# Patient Record
Sex: Female | Born: 1965
Health system: Southern US, Community
[De-identification: ages and names within clinical notes are randomized; demographics above are authoritative.]

## PROBLEM LIST (undated history)

## (undated) DIAGNOSIS — Z6828 Body mass index (BMI) 28.0-28.9, adult: Secondary | ICD-10-CM

## (undated) DIAGNOSIS — H8111 Benign paroxysmal vertigo, right ear: Secondary | ICD-10-CM

## (undated) DIAGNOSIS — R42 Dizziness and giddiness: Secondary | ICD-10-CM

## (undated) DIAGNOSIS — T4145XA Adverse effect of unspecified anesthetic, initial encounter: Secondary | ICD-10-CM

## (undated) DIAGNOSIS — R5383 Other fatigue: Secondary | ICD-10-CM

## (undated) DIAGNOSIS — F330 Major depressive disorder, recurrent, mild: Secondary | ICD-10-CM

## (undated) DIAGNOSIS — T8859XA Other complications of anesthesia, initial encounter: Secondary | ICD-10-CM

## (undated) DIAGNOSIS — E039 Hypothyroidism, unspecified: Secondary | ICD-10-CM

## (undated) DIAGNOSIS — C50919 Malignant neoplasm of unspecified site of unspecified female breast: Secondary | ICD-10-CM

## (undated) DIAGNOSIS — IMO0002 Reserved for concepts with insufficient information to code with codable children: Secondary | ICD-10-CM

## (undated) DIAGNOSIS — Z9889 Other specified postprocedural states: Secondary | ICD-10-CM

## (undated) DIAGNOSIS — IMO0001 Reserved for inherently not codable concepts without codable children: Secondary | ICD-10-CM

## (undated) DIAGNOSIS — E782 Mixed hyperlipidemia: Secondary | ICD-10-CM

## (undated) HISTORY — PX: AUGMENTATION MAMMAPLASTY: SUR837

## (undated) HISTORY — PX: TUBAL LIGATION: SHX77

## (undated) HISTORY — DX: Hypothyroidism, unspecified: E03.9

## (undated) HISTORY — DX: Benign paroxysmal vertigo, right ear: H81.11

## (undated) HISTORY — DX: Reserved for inherently not codable concepts without codable children: IMO0001

## (undated) HISTORY — DX: Reserved for concepts with insufficient information to code with codable children: IMO0002

## (undated) HISTORY — DX: Mixed hyperlipidemia: E78.2

## (undated) HISTORY — DX: Malignant neoplasm of unspecified site of unspecified female breast: C50.919

## (undated) HISTORY — DX: Dizziness and giddiness: R42

## (undated) HISTORY — DX: Major depressive disorder, recurrent, mild: F33.0

## (undated) HISTORY — DX: Body mass index (BMI) 28.0-28.9, adult: Z68.28

## (undated) HISTORY — DX: Other fatigue: R53.83

---

## 2001-11-03 HISTORY — PX: BREAST SURGERY: SHX581

## 2011-09-02 ENCOUNTER — Other Ambulatory Visit: Payer: Self-pay | Admitting: Unknown Physician Specialty

## 2011-09-02 DIAGNOSIS — R921 Mammographic calcification found on diagnostic imaging of breast: Secondary | ICD-10-CM

## 2011-09-11 ENCOUNTER — Ambulatory Visit
Admission: RE | Admit: 2011-09-11 | Discharge: 2011-09-11 | Disposition: A | Discharge status: Home / Self Care | Payer: No Typology Code available for payment source | Source: Ambulatory Visit | Attending: Unknown Physician Specialty | Admitting: Unknown Physician Specialty

## 2011-09-11 DIAGNOSIS — R921 Mammographic calcification found on diagnostic imaging of breast: Secondary | ICD-10-CM

## 2011-09-12 ENCOUNTER — Other Ambulatory Visit: Payer: Self-pay | Admitting: Unknown Physician Specialty

## 2011-09-12 DIAGNOSIS — C50911 Malignant neoplasm of unspecified site of right female breast: Secondary | ICD-10-CM

## 2011-09-18 ENCOUNTER — Other Ambulatory Visit: Payer: No Typology Code available for payment source

## 2011-09-19 ENCOUNTER — Ambulatory Visit (INDEPENDENT_AMBULATORY_CARE_PROVIDER_SITE_OTHER): Payer: No Typology Code available for payment source | Admitting: Surgery

## 2011-09-19 ENCOUNTER — Other Ambulatory Visit (INDEPENDENT_AMBULATORY_CARE_PROVIDER_SITE_OTHER): Payer: Self-pay | Admitting: Surgery

## 2011-09-19 ENCOUNTER — Encounter (INDEPENDENT_AMBULATORY_CARE_PROVIDER_SITE_OTHER): Payer: Self-pay | Admitting: Surgery

## 2011-09-19 VITALS — BP 118/76 | HR 70 | Temp 96.9°F | Resp 16 | Ht 65.5 in | Wt 173.2 lb

## 2011-09-19 DIAGNOSIS — C50911 Malignant neoplasm of unspecified site of right female breast: Secondary | ICD-10-CM

## 2011-09-19 DIAGNOSIS — D0590 Unspecified type of carcinoma in situ of unspecified breast: Secondary | ICD-10-CM

## 2011-09-19 DIAGNOSIS — D0510 Intraductal carcinoma in situ of unspecified breast: Secondary | ICD-10-CM | POA: Insufficient documentation

## 2011-09-19 NOTE — Progress Notes (Signed)
Patient ID: Dawn Barton, female   DOB: 01/21/1966, 45 y.o.   MRN: [id number]  Chief Complaint  Patient presents with  . Other    Right breast cancer    HPI Dawn Barton is a 45 y.o. female.  HPI Patient presents today at the request of Dr. Eagle due to a cluster of right breast microcalcifications. These are located in her lower outer quadrant of her right breast. 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 She denies any history of breast mass, breast pain or nipple discharge in either breast.  Past Medical History  Diagnosis Date  . Cancer     breast - right    Past Surgical History  Procedure Date  . Breast surgery 2003    breast lift    History reviewed. No pertinent family history.  Social History History  Substance Use Topics  . Smoking status: Former Smoker  . Smokeless tobacco: Never Used  . Alcohol Use: No    Not on File  No current outpatient prescriptions on file.    Review of Systems Review of Systems  Constitutional: Negative for fever, chills and unexpected weight change.  HENT: Negative for hearing loss, congestion, sore throat, trouble swallowing and voice change.   Eyes: Negative for visual disturbance.  Respiratory: Negative for cough and wheezing.   Cardiovascular: Negative for chest pain, palpitations and leg swelling.  Gastrointestinal: Negative for nausea, vomiting, abdominal pain, diarrhea, constipation, blood in stool, abdominal distention and anal bleeding.  Genitourinary: Negative for hematuria, vaginal bleeding and difficulty urinating.  Musculoskeletal: Negative for arthralgias.  Skin: Negative for rash and wound.  Neurological: Negative for seizures, syncope and headaches.  Hematological: Negative for adenopathy. Does not bruise/bleed easily.  Psychiatric/Behavioral: Negative for confusion.    Blood pressure 118/76, pulse 70, temperature 96.9 F (36.1 C), temperature source Temporal, resp. rate 16, height 5' 5.5" (1.664 m),  weight 173 lb 3.2 oz (78.563 kg).  Physical Exam Physical Exam  Constitutional: She is oriented to person, place, and time. She appears well-developed and well-nourished.  HENT:  Head: Normocephalic and atraumatic.  Eyes: EOM are normal. Pupils are equal, round, and reactive to light.  Neck: Normal range of motion. Neck supple.  Cardiovascular: Normal rate and regular rhythm.   Pulmonary/Chest: Effort normal and breath sounds normal.       Scars on both breasts from previous plastic surgical procedure consistent with a breast lift. No masses in either breast. Ecchymoses right breast lower noted. No evidence of extra adenopathy bilaterally.  Musculoskeletal: Normal range of motion.  Neurological: She is alert and oriented to person, place, and time.  Skin: Skin is warm and dry.  Psychiatric: She has a normal mood and affect. Her behavior is normal. Judgment and thought content normal.    Data Reviewed Mammogram Calcifications lower Right breast   Suspicious 1cm Core biopsy right breast  DCIS Low grade MRI BREAST no other abnormality Assessment    DCIS low grade right breast    Plan    Right breast lumpectomy.The procedure has been discussed with the patient. Alternatives to surgery have been discussed with the patient.  Risks of surgery include bleeding,  Infection,  Seroma formation, death,  and the need for further surgery.   The patient understands and wishes to proceed .Discussed mastectomy as an alternative to lumpectomy and radiation therapy. She understands potential need for radiation therapy after lumpectomy and wishes to proceed.        Darci Lykins A. 09/19/2011, 1:19 PM    

## 2011-09-19 NOTE — Patient Instructions (Signed)
Lumpectomy, Breast Conserving Surgery Care After Please read the instructions outlined below and refer to this sheet in the next few weeks. These discharge instructions provide you with general information on caring for yourself after you leave the hospital. Your surgeon may also give you specific instructions. While your treatment has been planned according to the most current medical practices available, unavoidable complications occasionally occur. If you have any problems or questions after discharge, please call your surgeon. Reasons for a lumpectomy:  Any solid breast mass.   Grouped significant nodularity that may be confused with a solitary breast mass.  AFTER THE PROCEDURE  After surgery, you will be taken to the recovery area where a nurse will watch and check your progress. Once you're awake, stable, and taking fluids well, barring other problems you will be allowed to go home.   Ice packs applied to your operative site may help with discomfort and keep the swelling down.   A small rubber drain may be placed in the incision for a couple of days to prevent a hematoma in the breast.   A pressure dressing may be applied for 24 to 48 hours to prevent bleeding.   Keep the wound dry.   You may resume a normal diet and activities as directed. Avoid strenuous activities affecting the arm on the side of the biopsy site such as tennis, swimming, heavy lifting (more than 10 pounds) or pulling.   Bruising in the breast is normal following this procedure.   Wearing a bra - even to bed - may be more comfortable and also help keep the dressing on.   Change dressings as directed.   Only take over-the-counter or prescription medicines for pain, discomfort, or fever as directed by your caregiver.  Call for your results as instructed by your surgeon. Remember it isyour responsibility to get the results of your lumpectomy if your surgeon asked you to follow-up. Do not assume everything is fine if  you have not heard from your caregiver. SEEK MEDICAL CARE IF:   There is increased bleeding (more than a small spot) from the wound.   You notice redness, swelling, or increasing pain in the wound.   Pus is coming from wound.   An unexplained oral temperature above 102 F (38.9 C) develops.   You notice a foul smell coming from the wound or dressing.  SEEK IMMEDIATE MEDICAL CARE IF:   You develop a rash.   You have difficulty breathing.   You have any allergic problems.  Document Released: 11/05/2006 Document Revised: 07/02/2011 Document Reviewed: 10/08/2007 [facility] Patient Information 2012 [place], [place].Lumpectomy, Breast Conserving Surgery A lumpectomy is breast surgery that removes only part of the breast. Another name used may be partial mastectomy. The amount removed varies. Make sure you understand how much of your breast will be removed. Reasons for a lumpectomy:  Any solid breast mass.   Grouped significant nodularity that may be confused with a solitary breast mass.  Lumpectomy is the most common form of breast cancer surgery today. The surgeon removes the portion of your breast which contains the tumor (cancer). This is the lump. Some normal tissue around the lump is also removed to be sure that all the tumor has been removed.  If cancer cells are found in the margins where the breast tissue was removed, your surgeon will do more surgery to remove the remaining cancer tissue. This is called re-excision surgery. Radiation and/or chemotherapy treatments are often given following a lumpectomy to kill any  cancer cells that could possibly remain.  REASONS YOU MAY NOT BE ABLE TO HAVE BREAST CONSERVING SURGERY:  The tumor is located in more than one place.   Your breast is small and the tumor is large so the breast would be disfigured.   The entire tumor removal is not successful with a lumpectomy.   You cannot commit to a full course of chemotherapy, radiation therapy or  are pregnant and cannot have radiation.   You have previously had radiation to the breast to treat cancer.  HOW A LUMPECTOMY IS PERFORMED If overnight nursing is not required following a biopsy, a lumpectomy can be performed as a same-day surgery. This can be done in a hospital, clinic, or surgical center. The anesthesia used will depend on your surgeon. They will discuss this with you. A general anesthetic keeps you sleeping through the procedure. LET YOUR CAREGIVERS KNOW ABOUT THE FOLLOWING:  Allergies   Medications taken including herbs, eye drops, over the counter medications, and creams.   Use of steroids (by mouth or creams)   Previous problems with anesthetics or Novocaine.   Possibility of pregnancy, if this applies   History of blood clots (thrombophlebitis)   History of bleeding or blood problems.   Previous surgery   Other health problems  BEFORE THE PROCEDURE You should be present one hour prior to your procedure unless directed otherwise.  AFTER THE PROCEDURE  After surgery, you will be taken to the recovery area where a nurse will watch and check your progress. Once you're awake, stable, and taking fluids well, barring other problems you will be allowed to go home.   Ice packs applied to your operative site may help with discomfort and keep the swelling down.   A small rubber drain may be placed in the breast for a couple of days to prevent a hematoma from developing in the breast.   A pressure dressing may be applied for 24 to 48 hours to prevent bleeding.   Keep the wound dry.   You may resume a normal diet and activities as directed. Avoid strenuous activities affecting the arm on the side of the biopsy site such as tennis, swimming, heavy lifting (more than 10 pounds) or pulling.   Bruising in the breast is normal following this procedure.   Wearing a bra - even to bed - may be more comfortable and also help keep the dressing on.   Change dressings as  directed.   Only take over-the-counter or prescription medicines for pain, discomfort, or fever as directed by your caregiver.  Call for your results as instructed by your surgeon. Remember it is your responsibility to get the results of your lumpectomy if your surgeon asked you to follow-up. Do not assume everything is fine if you have not heard from your caregiver. SEEK MEDICAL CARE IF:   There is increased bleeding (more than a small spot) from the wound.   You notice redness, swelling, or increasing pain in the wound.   Pus is coming from wound.   An unexplained oral temperature above 102 F (38.9 C) develops.   You notice a foul smell coming from the wound or dressing.  SEEK IMMEDIATE MEDICAL CARE IF:   You develop a rash.   You have difficulty breathing.   You have any allergic problems.  Document Released: 12/01/2006 Document Revised: 07/02/2011 Document Reviewed: 03/04/2007 [facility] Patient Information 2012 [place], [place].

## 2011-09-24 ENCOUNTER — Encounter (INDEPENDENT_AMBULATORY_CARE_PROVIDER_SITE_OTHER): Payer: Self-pay | Admitting: Obstetrics and Gynecology

## 2011-09-29 ENCOUNTER — Encounter (INDEPENDENT_AMBULATORY_CARE_PROVIDER_SITE_OTHER): Payer: Self-pay | Admitting: Surgery

## 2011-09-29 ENCOUNTER — Encounter (HOSPITAL_BASED_OUTPATIENT_CLINIC_OR_DEPARTMENT_OTHER): Payer: Self-pay | Admitting: *Deleted

## 2011-09-29 NOTE — Progress Notes (Signed)
To come in for labs and cxr

## 2011-09-30 ENCOUNTER — Encounter (HOSPITAL_BASED_OUTPATIENT_CLINIC_OR_DEPARTMENT_OTHER)
Admission: RE | Admit: 2011-09-30 | Discharge: 2011-09-30 | Disposition: A | Discharge status: Home / Self Care | Payer: No Typology Code available for payment source | Source: Ambulatory Visit | Attending: Surgery | Admitting: Surgery

## 2011-09-30 ENCOUNTER — Ambulatory Visit (HOSPITAL_COMMUNITY)
Admission: RE | Admit: 2011-09-30 | Discharge: 2011-09-30 | Disposition: A | Discharge status: Home / Self Care | Payer: No Typology Code available for payment source | Source: Ambulatory Visit | Attending: Surgery | Admitting: Surgery

## 2011-09-30 DIAGNOSIS — Z01818 Encounter for other preprocedural examination: Secondary | ICD-10-CM | POA: Insufficient documentation

## 2011-09-30 LAB — COMPREHENSIVE METABOLIC PANEL
ALT: 14 U/L (ref 0–35)
BUN: 10 mg/dL (ref 6–23)
CO2: 29 mEq/L (ref 19–32)
Calcium: 9.7 mg/dL (ref 8.4–10.5)
Creatinine, Ser: 0.69 mg/dL (ref 0.50–1.10)
GFR calc Af Amer: >90 mL/min (ref >90)
GFR calc non Af Amer: >90 mL/min (ref >90)
Glucose, Bld: 92 mg/dL (ref 70–99)
Total Protein: 7.8 g/dL (ref 6.0–8.3)

## 2011-09-30 LAB — CBC
HCT: 41.4 % (ref 36.0–46.0)
Hemoglobin: 14 g/dL (ref 12.0–15.0)
MCH: 30.6 pg (ref 26.0–34.0)
MCHC: 33.8 g/dL (ref 30.0–36.0)
MCV: 90.6 fL (ref 78.0–100.0)
RBC: 4.57 MIL/uL (ref 3.87–5.11)

## 2011-09-30 LAB — DIFFERENTIAL
Basophils Absolute: 0 10*3/uL (ref 0.0–0.1)
Eosinophils Relative: 3 % (ref 0–5)
Lymphocytes Relative: 36 % (ref 12–46)
Lymphs Abs: 2.1 10*3/uL (ref 0.7–4.0)
Neutro Abs: 3 10*3/uL (ref 1.7–7.7)
Neutrophils Relative %: 51 % (ref 43–77)

## 2011-10-02 ENCOUNTER — Ambulatory Visit
Admission: RE | Admit: 2011-10-02 | Discharge: 2011-10-02 | Disposition: A | Discharge status: Home / Self Care | Payer: No Typology Code available for payment source | Source: Ambulatory Visit | Attending: Surgery | Admitting: Surgery

## 2011-10-02 ENCOUNTER — Other Ambulatory Visit (INDEPENDENT_AMBULATORY_CARE_PROVIDER_SITE_OTHER): Payer: Self-pay | Admitting: Surgery

## 2011-10-02 ENCOUNTER — Encounter (HOSPITAL_BASED_OUTPATIENT_CLINIC_OR_DEPARTMENT_OTHER): Payer: Self-pay | Admitting: Certified Registered"

## 2011-10-02 ENCOUNTER — Encounter (HOSPITAL_BASED_OUTPATIENT_CLINIC_OR_DEPARTMENT_OTHER): Payer: Self-pay

## 2011-10-02 ENCOUNTER — Ambulatory Visit (HOSPITAL_BASED_OUTPATIENT_CLINIC_OR_DEPARTMENT_OTHER)
Admission: RE | Admit: 2011-10-02 | Discharge: 2011-10-02 | Disposition: A | Discharge status: Home / Self Care | Payer: No Typology Code available for payment source | Source: Ambulatory Visit | Attending: Surgery | Admitting: Surgery

## 2011-10-02 ENCOUNTER — Ambulatory Visit (HOSPITAL_BASED_OUTPATIENT_CLINIC_OR_DEPARTMENT_OTHER): Payer: No Typology Code available for payment source | Admitting: Certified Registered"

## 2011-10-02 ENCOUNTER — Encounter (HOSPITAL_BASED_OUTPATIENT_CLINIC_OR_DEPARTMENT_OTHER)
Admission: RE | Disposition: A | Discharge status: Home / Self Care | Payer: Self-pay | Source: Ambulatory Visit | Attending: Surgery

## 2011-10-02 ENCOUNTER — Encounter (HOSPITAL_BASED_OUTPATIENT_CLINIC_OR_DEPARTMENT_OTHER): Payer: Self-pay | Admitting: Surgery

## 2011-10-02 DIAGNOSIS — C50919 Malignant neoplasm of unspecified site of unspecified female breast: Secondary | ICD-10-CM | POA: Insufficient documentation

## 2011-10-02 DIAGNOSIS — Z01812 Encounter for preprocedural laboratory examination: Secondary | ICD-10-CM | POA: Insufficient documentation

## 2011-10-02 DIAGNOSIS — C50911 Malignant neoplasm of unspecified site of right female breast: Secondary | ICD-10-CM

## 2011-10-02 DIAGNOSIS — D0510 Intraductal carcinoma in situ of unspecified breast: Secondary | ICD-10-CM

## 2011-10-02 DIAGNOSIS — D0590 Unspecified type of carcinoma in situ of unspecified breast: Secondary | ICD-10-CM | POA: Insufficient documentation

## 2011-10-02 HISTORY — PX: BREAST LUMPECTOMY: SHX2

## 2011-10-02 HISTORY — DX: Malignant neoplasm of unspecified site of unspecified female breast: C50.919

## 2011-10-02 HISTORY — DX: Other complications of anesthesia, initial encounter: T88.59XA

## 2011-10-02 HISTORY — DX: Adverse effect of unspecified anesthetic, initial encounter: T41.45XA

## 2011-10-02 SURGERY — BREAST LUMPECTOMY
Anesthesia: General | Site: Breast | Laterality: Right | Wound class: Clean

## 2011-10-02 MED ORDER — CEFAZOLIN SODIUM-DEXTROSE 2-3 GM-% IV SOLR
2.0000 g | INTRAVENOUS | Status: AC
  Administered 2011-10-02: 2 g via INTRAVENOUS

## 2011-10-02 MED ORDER — EPHEDRINE SULFATE 50 MG/ML IJ SOLN
INTRAMUSCULAR | Status: DC | PRN
  Administered 2011-10-02: 5 mg via INTRAVENOUS

## 2011-10-02 MED ORDER — MIDAZOLAM HCL 5 MG/5ML IJ SOLN
INTRAMUSCULAR | Status: DC | PRN
  Administered 2011-10-02: 2 mg via INTRAVENOUS

## 2011-10-02 MED ORDER — LACTATED RINGERS IV SOLN
INTRAVENOUS | Status: DC
  Administered 2011-10-02: 12:00:00 via INTRAVENOUS

## 2011-10-02 MED ORDER — PROPOFOL 10 MG/ML IV EMUL
INTRAVENOUS | Status: DC | PRN
  Administered 2011-10-02: 170 mg via INTRAVENOUS

## 2011-10-02 MED ORDER — DEXAMETHASONE SODIUM PHOSPHATE 4 MG/ML IJ SOLN
INTRAMUSCULAR | Status: DC | PRN
  Administered 2011-10-02: 10 mg via INTRAVENOUS

## 2011-10-02 MED ORDER — ONDANSETRON HCL 4 MG/2ML IJ SOLN: 4.0000 mg | Freq: Once | INTRAMUSCULAR | Status: DC | PRN

## 2011-10-02 MED ORDER — FENTANYL CITRATE 0.05 MG/ML IJ SOLN
INTRAMUSCULAR | Status: DC | PRN
  Administered 2011-10-02: 100 ug via INTRAVENOUS

## 2011-10-02 MED ORDER — BUPIVACAINE-EPINEPHRINE 0.25% -1:200000 IJ SOLN
INTRAMUSCULAR | Status: DC | PRN
  Administered 2011-10-02: 20 mL

## 2011-10-02 MED ORDER — OXYCODONE-ACETAMINOPHEN 10-325 MG PO TABS: 1.0000 | ORAL_TABLET | ORAL | Status: DC | PRN

## 2011-10-02 MED ORDER — HYDROMORPHONE HCL PF 1 MG/ML IJ SOLN: 0.2500 mg | INTRAMUSCULAR | Status: DC | PRN

## 2011-10-02 MED ORDER — ONDANSETRON HCL 4 MG/2ML IJ SOLN
INTRAMUSCULAR | Status: DC | PRN
  Administered 2011-10-02: 4 mg via INTRAVENOUS

## 2011-10-02 SURGICAL SUPPLY — 42 items
BLADE SURG 15 STRL LF DISP TIS (BLADE) ×1 IMPLANT
BLADE SURG 15 STRL SS (BLADE) ×1
CANISTER SUCTION 1200CC (MISCELLANEOUS) ×2 IMPLANT
CHLORAPREP W/TINT 26ML (MISCELLANEOUS) ×2 IMPLANT
CLIP TI WIDE RED SMALL 6 (CLIP) ×2 IMPLANT
CLOTH BEACON ORANGE TIMEOUT ST (SAFETY) ×2 IMPLANT
COVER MAYO STAND STRL (DRAPES) ×2 IMPLANT
COVER TABLE BACK 60X90 (DRAPES) ×2 IMPLANT
DECANTER SPIKE VIAL GLASS SM (MISCELLANEOUS) IMPLANT
DERMABOND ADVANCED (GAUZE/BANDAGES/DRESSINGS) ×1
DERMABOND ADVANCED .7 DNX12 (GAUZE/BANDAGES/DRESSINGS) ×1 IMPLANT
DEVICE DUBIN W/COMP PLATE 8390 (MISCELLANEOUS) ×2 IMPLANT
DRAPE LAPAROSCOPIC ABDOMINAL (DRAPES) IMPLANT
DRAPE PED LAPAROTOMY (DRAPES) ×2 IMPLANT
DRAPE UTILITY XL STRL (DRAPES) ×2 IMPLANT
ELECT COATED BLADE 2.86 ST (ELECTRODE) ×2 IMPLANT
ELECT REM PT RETURN 9FT ADLT (ELECTROSURGICAL) ×2
ELECTRODE REM PT RTRN 9FT ADLT (ELECTROSURGICAL) ×1 IMPLANT
GLOVE BIO SURGEON STRL SZ 6.5 (GLOVE) ×4 IMPLANT
GLOVE BIOGEL PI IND STRL 8 (GLOVE) ×1 IMPLANT
GLOVE BIOGEL PI INDICATOR 8 (GLOVE) ×1
GLOVE ECLIPSE 8.0 STRL XLNG CF (GLOVE) ×2 IMPLANT
GOWN PREVENTION PLUS XLARGE (GOWN DISPOSABLE) ×4 IMPLANT
KIT MARKER MARGIN INK (KITS) ×2 IMPLANT
NEEDLE HYPO 25X1 1.5 SAFETY (NEEDLE) ×2 IMPLANT
NS IRRIG 1000ML POUR BTL (IV SOLUTION) ×2 IMPLANT
PACK BASIN DAY SURGERY FS (CUSTOM PROCEDURE TRAY) ×2 IMPLANT
PENCIL BUTTON HOLSTER BLD 10FT (ELECTRODE) ×2 IMPLANT
SLEEVE SCD COMPRESS KNEE MED (MISCELLANEOUS) ×2 IMPLANT
SPONGE LAP 4X18 X RAY DECT (DISPOSABLE) ×2 IMPLANT
STAPLER VISISTAT 35W (STAPLE) IMPLANT
SUT MON AB 4-0 PC3 18 (SUTURE) ×2 IMPLANT
SUT SILK 2 0 SH (SUTURE) IMPLANT
SUT VIC AB 3-0 SH 27 (SUTURE) ×1
SUT VIC AB 3-0 SH 27X BRD (SUTURE) ×1 IMPLANT
SYR BULB 3OZ (MISCELLANEOUS) ×2 IMPLANT
SYR CONTROL 10ML LL (SYRINGE) ×2 IMPLANT
TOWEL OR 17X24 6PK STRL BLUE (TOWEL DISPOSABLE) ×4 IMPLANT
TOWEL OR NON WOVEN STRL DISP B (DISPOSABLE) ×2 IMPLANT
TUBE CONNECTING 20X1/4 (TUBING) ×2 IMPLANT
WATER STERILE IRR 1000ML POUR (IV SOLUTION) ×2 IMPLANT
YANKAUER SUCT BULB TIP NO VENT (SUCTIONS) ×2 IMPLANT

## 2011-10-02 NOTE — H&P (View-Only) (Signed)
Patient ID: Dawn Barton, female   DOB: [birth year], 45 y.o.   MRN: [id number]  Chief Complaint  Patient presents with  . Other    Right breast cancer    HPI Dawn Barton is a 45 y.o. female.  HPI Patient presents today at the request of Dr. Deboraha Sprang due to a cluster of right breast microcalcifications. These are located in her lower outer quadrant of her right breast. 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 She denies any history of breast mass, breast pain or nipple discharge in either breast.  Past Medical History  Diagnosis Date  . Cancer     breast - right    Past Surgical History  Procedure Date  . Breast surgery 2003    breast lift    History reviewed. No pertinent family history.  Social History History  Substance Use Topics  . Smoking status: Former Games developer  . Smokeless tobacco: Never Used  . Alcohol Use: No    Not on File  No current outpatient prescriptions on file.    Review of Systems Review of Systems  Constitutional: Negative for fever, chills and unexpected weight change.  HENT: Negative for hearing loss, congestion, sore throat, trouble swallowing and voice change.   Eyes: Negative for visual disturbance.  Respiratory: Negative for cough and wheezing.   Cardiovascular: Negative for chest pain, palpitations and leg swelling.  Gastrointestinal: Negative for nausea, vomiting, abdominal pain, diarrhea, constipation, blood in stool, abdominal distention and anal bleeding.  Genitourinary: Negative for hematuria, vaginal bleeding and difficulty urinating.  Musculoskeletal: Negative for arthralgias.  Skin: Negative for rash and wound.  Neurological: Negative for seizures, syncope and headaches.  Hematological: Negative for adenopathy. Does not bruise/bleed easily.  Psychiatric/Behavioral: Negative for confusion.    Blood pressure 118/76, pulse 70, temperature 96.9 F (36.1 C), temperature source Temporal, resp. rate 16, height 5' 5.5" (1.664 m),  weight 173 lb 3.2 oz (78.563 kg).  Physical Exam Physical Exam  Constitutional: She is oriented to person, place, and time. She appears well-developed and well-nourished.  HENT:  Head: Normocephalic and atraumatic.  Eyes: EOM are normal. Pupils are equal, round, and reactive to light.  Neck: Normal range of motion. Neck supple.  Cardiovascular: Normal rate and regular rhythm.   Pulmonary/Chest: Effort normal and breath sounds normal.       Scars on both breasts from previous plastic surgical procedure consistent with a breast lift. No masses in either breast. Ecchymoses right breast lower noted. No evidence of extra adenopathy bilaterally.  Musculoskeletal: Normal range of motion.  Neurological: She is alert and oriented to person, place, and time.  Skin: Skin is warm and dry.  Psychiatric: She has a normal mood and affect. Her behavior is normal. Judgment and thought content normal.    Data Reviewed Mammogram Calcifications lower Right breast   Suspicious 1cm Core biopsy right breast  DCIS Low grade MRI BREAST no other abnormality Assessment    DCIS low grade right breast    Plan    Right breast lumpectomy.The procedure has been discussed with the patient. Alternatives to surgery have been discussed with the patient.  Risks of surgery include bleeding,  Infection,  Seroma formation, death,  and the need for further surgery.   The patient understands and wishes to proceed .Discussed mastectomy as an alternative to lumpectomy and radiation therapy. She understands potential need for radiation therapy after lumpectomy and wishes to proceed.        Lyndsy Gilberto A. 09/19/2011, 1:19 PM

## 2011-10-02 NOTE — Op Note (Signed)
Preop diagnosis: Right breast DCIS  Postop diagnosis: Same  Procedure right breast partial mastectomy with wire localization  Surgeon: Harriette Bouillon M.D.  EBL: 50 cc  Specimen: Right breast tissue with wire and clip  Anesthesia: Gen. endotracheal anesthesia with 0.25% bupivacaine with epinephrine  Drains: None  Indications for procedure: The patient is a 45 year old female diagnosed with right breast DCIS. This is a very small area. She was to undergo local excision. She understood there was a potential for postop radiation therapy depending on final pathology. Risks, benefits and alternative therapies were discussed. Risks of procedure were discussed and are outlined in the history and physical. She agrees to proceed.  Description of procedure: The patient brought back to the operating room after undergoing right breast wire localization at the breast center [facility]. Questions were answered. She's placed supine on the operating room table. After induction of general endotracheal anesthesia, right breast is prepped and draped in a sterile fashion. The wire exited the inferior portion of the right breast adjacent to a previous breast reduction scar. A vertical incision was placed just adjacent to the wire through the old breast reduction scar. All tissue around the wire was excised in its entirety. The tissue is then placed in the x-ray machine showing the wire and clip to be in the specimen. Balloon was irrigated and closed in layers with a deep layer of 3-0 Vicryl and 4-0 Monocryl. All final counts sponge, needle and instrument count to be correct. She was awoke taken to recovery in satisfactory condition.

## 2011-10-02 NOTE — Transfer of Care (Signed)
Immediate Anesthesia Transfer of Care Note  Patient: Dawn Barton  Procedure(s) Performed:  LUMPECTOMY - right breast lumpectomy  Patient Location: PACU  Anesthesia Type: General  Level of Consciousness: awake, alert , oriented and patient cooperative  Airway & Oxygen Therapy: Patient Spontanous Breathing and Patient connected to face mask oxygen  Post-op Assessment: Report given to PACU RN and Post -op Vital signs reviewed and stable  Post vital signs: Reviewed and stable  Complications: No apparent anesthesia complications

## 2011-10-02 NOTE — Interval H&P Note (Signed)
History and Physical Interval Note:  10/02/2011 10:25 AM  Dawn Barton  has presented today for surgery, with the diagnosis of cancer right breast  The various methods of treatment have been discussed with the patient and family. After consideration of risks, benefits and other options for treatment, the patient has consented to  Procedure(s): LUMPECTOMY as a surgical intervention .  The patients' history has been reviewed, patient examined, no change in status, stable for surgery.  I have reviewed the patients' chart and labs.  Questions were answered to the patient's satisfaction.     Zebediah Beezley A.

## 2011-10-02 NOTE — Anesthesia Preprocedure Evaluation (Signed)
Anesthesia Evaluation  Patient identified by MRN, date of birth, ID band Patient awake    Reviewed: Allergy & Precautions, H&P , NPO status , Patient's Chart, lab work & pertinent test results  Airway Mallampati: I TM Distance: >3 FB Neck ROM: Full    Dental  (+) Teeth Intact   Pulmonary neg pulmonary ROS,  clear to auscultation        Cardiovascular neg cardio ROS Regular Normal    Neuro/Psych    GI/Hepatic negative GI ROS, Neg liver ROS,   Endo/Other    Renal/GU      Musculoskeletal   Abdominal   Peds  Hematology   Anesthesia Other Findings   Reproductive/Obstetrics                           Anesthesia Physical Anesthesia Plan  ASA: I  Anesthesia Plan: General   Post-op Pain Management:    Induction: Intravenous  Airway Management Planned: LMA  Additional Equipment:   Intra-op Plan:   Post-operative Plan:   Informed Consent: I have reviewed the patients History and Physical, chart, labs and discussed the procedure including the risks, benefits and alternatives for the proposed anesthesia with the patient or authorized representative who has indicated his/her understanding and acceptance.   Dental advisory given  Plan Discussed with: CRNA and Surgeon  Anesthesia Plan Comments:         Anesthesia Quick Evaluation

## 2011-10-02 NOTE — Interval H&P Note (Signed)
History and Physical Interval Note:  10/02/2011 12:26 PM  Dawn Barton  has presented today for surgery, with the diagnosis of cancer right breast  The various methods of treatment have been discussed with the patient and family. After consideration of risks, benefits and other options for treatment, the patient has consented to  Procedure(s): LUMPECTOMY as a surgical intervention .  The patients' history has been reviewed, patient examined, no change in status, stable for surgery.  I have reviewed the patients' chart and labs.  Questions were answered to the patient's satisfaction.     [name]

## 2011-10-02 NOTE — Anesthesia Procedure Notes (Signed)
Procedure Name: LMA Insertion Date/Time: 10/02/2011 12:59 PM Performed by: Radford Pax Pre-anesthesia Checklist: Patient identified, Emergency Drugs available, Suction available and Patient being monitored Patient Re-evaluated:Patient Re-evaluated prior to inductionOxygen Delivery Method: Circle System Utilized Preoxygenation: Pre-oxygenation with 100% oxygen Intubation Type: IV induction Ventilation: Mask ventilation without difficulty LMA: LMA inserted LMA Size: 4.0 Number of attempts: 1 (good fit and airway, bite gard used on lt.) Placement Confirmation: positive ETCO2 and breath sounds checked- equal and bilateral Tube secured with: Tape (plastic tape) Dental Injury: Teeth and Oropharynx as per pre-operative assessment

## 2011-10-02 NOTE — Anesthesia Postprocedure Evaluation (Signed)
  Anesthesia Post-op Note  Patient: Dawn Barton  Procedure(s) Performed:  LUMPECTOMY - right breast lumpectomy  Patient Location: PACU  Anesthesia Type: General  Level of Consciousness: awake, alert  and oriented  Airway and Oxygen Therapy: Patient Spontanous Breathing  Post-op Pain: mild  Post-op Assessment: Post-op Vital signs reviewed and Patient's Cardiovascular Status Stable  Post-op Vital Signs: stable  Complications: No apparent anesthesia complications

## 2011-10-02 NOTE — Interval H&P Note (Signed)
History and Physical Interval Note:  10/02/2011 12:23 PM  Dawn Barton  has presented today for surgery, with the diagnosis of cancer right breast  The various methods of treatment have been discussed with the patient and family. After consideration of risks, benefits and other options for treatment, the patient has consented to  Procedure(s): LUMPECTOMY as a surgical intervention .  The patients' history has been reviewed, patient examined, no change in status, stable for surgery.  I have reviewed the patients' chart and labs.  Questions were answered to the patient's satisfaction.     [name] A.

## 2011-10-03 ENCOUNTER — Encounter (HOSPITAL_BASED_OUTPATIENT_CLINIC_OR_DEPARTMENT_OTHER): Payer: Self-pay | Admitting: Surgery

## 2011-10-06 ENCOUNTER — Telehealth (INDEPENDENT_AMBULATORY_CARE_PROVIDER_SITE_OTHER): Payer: Self-pay | Admitting: Surgery

## 2011-10-06 NOTE — Telephone Encounter (Signed)
Pt had a lumpectomy on 10/02/11, needs a 5-7 day po, Dr. Kristie Cowman soonest available is 10/17/11, please call.

## 2011-10-07 NOTE — Telephone Encounter (Signed)
PT'S APPT IS MADE FOR 10-17-11/ OK PER DR. Luisa Hart

## 2011-10-10 ENCOUNTER — Telehealth: Payer: Self-pay | Admitting: *Deleted

## 2011-10-10 NOTE — Telephone Encounter (Signed)
Confirmed 10/20/11 appts w/ pt for Dr. Donnie Coffin & Dr. Roselind Messier w/ pt. Mailed before appt letter & packet to pt.  Gave paperwork to [employer] in Med Rec for chart.

## 2011-10-14 ENCOUNTER — Other Ambulatory Visit: Payer: Self-pay | Admitting: *Deleted

## 2011-10-14 DIAGNOSIS — D0510 Intraductal carcinoma in situ of unspecified breast: Secondary | ICD-10-CM

## 2011-10-15 ENCOUNTER — Ambulatory Visit (INDEPENDENT_AMBULATORY_CARE_PROVIDER_SITE_OTHER): Payer: No Typology Code available for payment source | Admitting: Surgery

## 2011-10-15 ENCOUNTER — Encounter (INDEPENDENT_AMBULATORY_CARE_PROVIDER_SITE_OTHER): Payer: Self-pay | Admitting: Surgery

## 2011-10-15 VITALS — BP 118/80 | HR 72 | Temp 97.8°F | Resp 16 | Ht 65.5 in | Wt 178.4 lb

## 2011-10-15 DIAGNOSIS — Z9889 Other specified postprocedural states: Secondary | ICD-10-CM

## 2011-10-15 NOTE — Patient Instructions (Signed)
Follow up 1 month

## 2011-10-15 NOTE — Progress Notes (Signed)
Dawn Barton    [id number] 10/15/2011    July 01, 1966   CC: Post op  Right lumpectomy HPI: The patient returns for post op follow-up. She underwent a right lumpectomy  For DCIS. Over all she feels that she is doing well.   PE: The incision is healing nicely and there is no evidence of infection or hematoma.    DATA REVIEWED: Pathology report showed LOW GRADE DCIS  Margin 0.7 mm  IMPRESSION: Low grade DCIS Patient doing well.   PLAN: Her next visit will be in 1 MONTH To see med rad oncology next week.

## 2011-10-17 ENCOUNTER — Encounter: Payer: Self-pay | Admitting: *Deleted

## 2011-10-17 NOTE — Progress Notes (Signed)
Right Breast Lumpectomy 10/02/11=low grade ductal carcinoma in situ,ER/PR=positive Dr. Harriette Bouillon

## 2011-10-20 ENCOUNTER — Ambulatory Visit
Admission: RE | Admit: 2011-10-20 | Discharge: 2011-10-20 | Disposition: A | Discharge status: Home / Self Care | Payer: No Typology Code available for payment source | Source: Ambulatory Visit | Attending: Radiation Oncology | Admitting: Radiation Oncology

## 2011-10-20 ENCOUNTER — Other Ambulatory Visit (HOSPITAL_BASED_OUTPATIENT_CLINIC_OR_DEPARTMENT_OTHER): Payer: No Typology Code available for payment source | Admitting: Lab

## 2011-10-20 ENCOUNTER — Ambulatory Visit (HOSPITAL_BASED_OUTPATIENT_CLINIC_OR_DEPARTMENT_OTHER): Payer: No Typology Code available for payment source | Admitting: Oncology

## 2011-10-20 ENCOUNTER — Encounter: Payer: Self-pay | Admitting: Radiation Oncology

## 2011-10-20 ENCOUNTER — Ambulatory Visit: Payer: No Typology Code available for payment source

## 2011-10-20 VITALS — BP 116/68 | HR 65 | Temp 98.4°F | Resp 20 | Ht 66.0 in | Wt 180.2 lb

## 2011-10-20 VITALS — BP 106/67 | HR 62 | Temp 98.7°F | Ht 65.0 in | Wt 178.6 lb

## 2011-10-20 DIAGNOSIS — D0510 Intraductal carcinoma in situ of unspecified breast: Secondary | ICD-10-CM

## 2011-10-20 DIAGNOSIS — D0590 Unspecified type of carcinoma in situ of unspecified breast: Secondary | ICD-10-CM

## 2011-10-20 DIAGNOSIS — Z510 Encounter for antineoplastic radiation therapy: Secondary | ICD-10-CM | POA: Insufficient documentation

## 2011-10-20 LAB — COMPREHENSIVE METABOLIC PANEL
Albumin: 4.2 g/dL (ref 3.5–5.2)
CO2: 30 mEq/L (ref 19–32)
Glucose, Bld: 84 mg/dL (ref 70–99)
Potassium: 4 mEq/L (ref 3.5–5.3)
Sodium: 141 mEq/L (ref 135–145)
Total Bilirubin: 0.2 mg/dL — ABNORMAL LOW (ref 0.3–1.2)
Total Protein: 7.4 g/dL (ref 6.0–8.3)

## 2011-10-20 LAB — CBC WITH DIFFERENTIAL/PLATELET
Eosinophils Absolute: 0.1 10*3/uL (ref 0.0–0.5)
HCT: 41.2 % (ref 34.8–46.6)
HGB: 14.3 g/dL (ref 11.6–15.9)
LYMPH%: 29.8 % (ref 14.0–49.7)
MONO#: 0.4 10*3/uL (ref 0.1–0.9)
NEUT#: 3.5 10*3/uL (ref 1.5–6.5)
Platelets: 210 10*3/uL (ref 145–400)
RBC: 4.51 10*6/uL (ref 3.70–5.45)
WBC: 5.8 10*3/uL (ref 3.9–10.3)

## 2011-10-20 NOTE — Progress Notes (Signed)
Married, 2 children, no family hx ovarian or breast ca  ,  Nkda

## 2011-10-20 NOTE — Progress Notes (Signed)
CC:   Thomas A. Cornett, M.D. Pierce Crane, M.D., F.R.C.P.C. Maximiano Coss  REFERRING PHYSICIAN:  Clovis Pu. Cornett, M.D.  DIAGNOSIS:  Intraductal carcinoma of the right breast.  HISTORY OF PRESENT ILLNESS:  Dawn Barton is a very pleasant 45 year old female who is seen out of the courtesy of Dr. Luisa Hart as it relates to the patient's recently diagnosed right breast cancer.  Dawn Barton, on a routine screening mammography earlier this year within the [facility], was noted have some calcifications in the lower-outer aspect of the right breast.  Additional imaging was subsequently performed which confirmed these suspicious calcifications.  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  In light of these findings, the patient proceeded to undergo MRI in the [place] area, which showed some biopsy changes but no other areas of concern in the right breast or axillary region.  The patient was subsequently seen by Dr. Luisa Hart, and on November 29th, the patient proceeded to undergo a partial mastectomy. Upon pathologic review the patient was found have a low-grade ductal carcinoma in situ.  The surgical margins were clear with the closest margin being 0.7 cm (lateral and medial).  The estimated tumor size, based on initial biopsy and subsequent lumpectomy, was approximately 1 cm.  The tumor was estrogen receptor positive at 100%, and progesterone receptor positive at 4%.  The patient has been doing well since her surgery with minimal discomfort.  The patient is now seen in Radiation Oncology for consultation as it relates to her new diagnosis of breast cancer.  ALLERGIES:  The patient has no known drug allergies.  MEDICATIONS: The patient is on no prescription medication.  PAST MEDICAL HISTORY:  The patient has no significant medical  history.  PAST SURGICAL HISTORY:  Prior surgeries include a tubal ligation, cosmetic surgery of the breast area in 2003 with a breast lift.  SOCIAL HISTORY:  The patient lives in the [place] area with her husband and family.  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  The patient has occasional alcohol intake.  The patient is a former smoker.  FAMILY HISTORY:  No family history of breast, ovarian, or other type of malignancy.  REVIEW OF SYSTEMS:  As above, the patient has had some mild discomfort in the breast, but otherwise is doing well.  The patient denies any new bony pain, headaches, dizziness or blurred vision.  The patient has not had a menstrual cycle since March of this year.  The patient does admit to being somewhat emotional over the past few months.  A complete review of systems is undertaken with the patient, and other than the above- mentioned issues is unremarkable.  PHYSICAL EXAMINATION:  General:  This a very pleasant, 45 year old female in no acute distress.  The patient is accompanied by her daughter.  On evaluation today, vital signs:  Temperature 98.4, pulse 65, blood pressure 116/68.  The patient's weight is 180.32 and height is 5 feet 6 inches.  Examination of the pupils reveals them to be equal, round and reactive to light.  The extraocular eye movements are intact. The tongue is midline.  There is no secondary infection noted in the oral cavity or posterior pharynx.  The patient's teeth are in good repair.  Examination of the neck and supraclavicular region reveals no evidence of adenopathy.  The axillary areas are free of adenopathy.  Examination of the lungs reveals them to be clear.  The heart has a regular rhythm and rate.  Examination of the left breast reveals no mass or nipple discharge.  The patient has scars in the breast from prior cosmetic surgery.  Examination right breast reveals prior signs of cosmetic surgery.  In  the lower-outer quadrant of the right breast is a scar which is healing well.  There are no signs of drainage or infection.  There is no dominant mass appreciated in the breast, nipple discharge, or bleeding.  Examination the abdomen reveals it to be soft and nontender with normal bowel sounds.  On neurological examination, motor strength is 5/5 in the proximal and distal muscle groups of the upper and lower extremities.  Peripheral pulses are good.  There is no appreciable cyanosis, clubbing or edema.  LABORATORY DATA:  October 02, 2011:  White count 5.9, hemoglobin 14.0, hematocrit 41.4, platelet count 197,000.  Glucose is 92, BUN 10, creatinine 0.69.  X-RAY STUDIES:  As summarized in the HPI.  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  As above, the estimated tumor size was approximately 1 cm based on initial biopsy and lumpectomy.  The patient's surgical margins are clear.  I discussed with Dawn Barton that in general she has an excellent prognosis with a low-grade intraductal breast cancer which has been completely excised.  The patient's risk for recurrence within the breast would be relatively low; however, given the patient's young age, I would be in favor of radiation therapy as part of breast conservation treatment.  I discussed the overall treatment course, side effects, and potential toxicities of radiation therapy in this situation with Dawn Barton.  The patient appears to understand and wishes to proceed with the planned course of treatment.  The patient will return on December 31st for planning, with her treatments to begin the 1st week in January. I anticipate approximately 6-1/2 weeks as part of the patient's overall management.  Later today the patient will be meeting with Dr. Donnie Coffin for a medical oncology evaluation.    ______________________________ Billie Lade, Ph.D., M.D. JDK/MEDQ  D:  10/20/2011  T:  10/20/2011  Job:   [id number]

## 2011-10-20 NOTE — Progress Notes (Signed)
Please see the Nurse Progress Note in the MD Initial Consult Encounter for this patient. 

## 2011-10-20 NOTE — Progress Notes (Signed)
Patient History and Physical   Dawn Barton [id number] 1965-11-15 45 y.o. 10/20/2011  CC: Dr Dawn Barton, dr Dawn Barton  Chief Complaint: DCIS  HPI:  [name] from [place] in previous good health , who presents with an abnormal mammogram. She has undergone annual mammography. This was initially performed in [place]. A cluster of right breast calcifications were seen in the lower outer quadrant of the right breast. 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 The patient underwent lumpectomy on 10/02/2011. This showed low-grade DCIS, grade 1, with clear surgical margins. There were a few foci of in situ carcinoma ranging from 0.1 2.3 cm in and around the previous biopsy cavity. The patient had an unremarkable postoperative course. She saw Dr. Roselind Barton today. Arrangements have today for simulation.  PMH: Past Medical History  Diagnosis Date  . Cancer     breast - right  . Complication of anesthesia     slow to wake up  . Breast cancer 10/02/11    right low grade ductal carcinoma in situ,er/prpositive    Past Surgical History  Procedure Date  . Tubal ligation   . Breast surgery 2003    breast lift  . Breast lumpectomy 10/02/2011    Procedure: LUMPECTOMY;  Surgeon: Dawn Fus A. Cornett, MD;  Location: [facility];  Service: General;  Laterality: Right;  right breast lumpectomy  G2P2, menarche 11, menopause 41, brief BCP use , no HRT  Allergies: No Known Allergies  Medications: No current outpatient prescriptions on file as of 10/20/2011.   No current facility-administered medications on file as of 10/20/2011.    Social History:   reports that she has quit smoking. She has never used smokeless tobacco. She reports that she drinks alcohol. She reports that she does not use illicit drugs.  the patient has been married twice first time 6  years second time for 15 years. She has a daughter age 68, who attends [employer] in [place]. She has a second daughter is 59 also 73-year-old child. 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 Her husband works for [employer].Both parents are A+W, she has 1 brother in good health.  Family History: No family history on file.  no other history of breast cancer in the family. Her mother has undergone a number of lumpectomies for fibrocystic disease.   Review of Systems: Constitutional ROS: Fever no, , Chills, Night Sweats, Anorexia, Pain , no Cardiovascular ROS: no chest pain or dyspnea on exertion Respiratory ROS: no cough, shortness of breath, or wheezing Neurological ROS: no TIA or stroke symptoms Dermatological ROS: negative ENT ROS: negative Gastrointestinal ROS: no abdominal pain, change in bowel habits, or black or bloody stools Genito-Urinary ROS: no dysuria, trouble voiding, or hematuria Hematological and Lymphatic ROS: negative Breast ROS: negative for breast lumps Musculoskeletal ROS: negative Remaining ROS negative.  Physical Exam: Blood pressure 106/67, pulse 62, temperature 98.7 F (37.1 C), height 5\' 5"  (1.651 m), weight 178 lb 9.6 oz (81.012 kg). General appearance: alert, cooperative and appears stated age Head: Normocephalic, without obvious abnormality, atraumatic Neck: no adenopathy, no carotid bruit, no JVD, supple, symmetrical, trachea midline and thyroid not enlarged, symmetric, no tenderness/mass/nodules Lymph nodes: Cervical, supraclavicular, and axillary nodes normal. Cardiac : Normal   pulmonary: Normal breath sounds Breasts: Right breast status post biopsy and  lumpectomy, no obvious masses; left breast normal; both axilla normal Abdomen: No organomegaly or masses Extremities: No cyanosis clubbing or edema Neuro: Grossly normal   Lab Results: Lab Results  Component Value Date     WBC 5.8 10/20/2011   HGB 14.3 10/20/2011   HCT 41.2 10/20/2011   MCV 91.2 10/20/2011   PLT 210 10/20/2011     Chemistry      Component Value Date/Time   NA 141 10/20/2011 1622   K 4.0 10/20/2011 1622   CL 105 10/20/2011 1622   CO2 30 10/20/2011 1622   BUN 9 10/20/2011 1622   CREATININE 0.73 10/20/2011 1622      Component Value Date/Time   CALCIUM 9.6 10/20/2011 1622   ALKPHOS 59 10/20/2011 1622   AST 14 10/20/2011 1622   ALT 13 10/20/2011 1622   BILITOT 0.2* 10/20/2011 1622       Radiological Studies: n/a  Impression and Plan: Postmenopausal  Woman with history of ER positive, PR positive DCIS status post excision. We had a 50 minute discussion regarding management of her DCIS. She is ER/PR positive she is a good candidate for tamoxifen therapy. We discussed side effects of tamoxifen. We also discussed enrollment in the B. 43 study. This is a randomized study for those patients with HER-2 positive DCIS. For this patient for HER-2 positive there is an opportunity to receive 2 doses of Herceptin prior to receiving radiation therapy. I will have the research nurse contact the patient for a further discussion of this option. The patient is also under 45 it should be referred for genetic testing given that she does not have a positive family history for breast cancer or ovarian cancer. I will see the patient back in followup after she has completed radiation therapy. I suspect her positive remission and/or cure are quite high.     Pierce Crane, MD 10/20/2011, 11:47 PM

## 2011-10-22 ENCOUNTER — Encounter: Payer: Self-pay | Admitting: *Deleted

## 2011-10-22 NOTE — Progress Notes (Signed)
Encounter addended by: Elena Davia Bruner Luchiano Viscomi, RN on: 10/22/2011  7:36 AM<BR>     Documentation filed: Charges VN

## 2011-10-23 ENCOUNTER — Encounter: Payer: Self-pay | Admitting: *Deleted

## 2011-10-23 NOTE — Progress Notes (Signed)
Mailed after appt letter to pt. 

## 2011-11-03 ENCOUNTER — Ambulatory Visit: Payer: No Typology Code available for payment source

## 2011-11-03 ENCOUNTER — Ambulatory Visit: Payer: No Typology Code available for payment source | Admitting: Radiation Oncology

## 2011-11-05 ENCOUNTER — Ambulatory Visit
Admission: RE | Admit: 2011-11-05 | Discharge: 2011-11-05 | Disposition: A | Discharge status: Home / Self Care | Payer: No Typology Code available for payment source | Source: Ambulatory Visit | Attending: Radiation Oncology | Admitting: Radiation Oncology

## 2011-11-05 ENCOUNTER — Other Ambulatory Visit: Payer: Self-pay | Admitting: *Deleted

## 2011-11-05 ENCOUNTER — Ambulatory Visit: Payer: No Typology Code available for payment source

## 2011-11-05 DIAGNOSIS — D0510 Intraductal carcinoma in situ of unspecified breast: Secondary | ICD-10-CM

## 2011-11-05 DIAGNOSIS — C50919 Malignant neoplasm of unspecified site of unspecified female breast: Secondary | ICD-10-CM

## 2011-11-05 LAB — PREGNANCY, URINE: Preg Test, Ur: NEGATIVE

## 2011-11-05 NOTE — Progress Notes (Signed)
DIAGNOSIS:  Right breast cancer.  NARRATIVE:  Earlier today Mrs. Yowell underwent treatment planning to begin radiation therapy directed at the right breast area.  The patient was placed on the CT simulator table the breast QUEST multi-positioning device.  The patient also had a custom Aquaphor mold placed underneath the neck and head area for accurate positioning during treatment.  The patient then proceeded to undergo a CT scan in the treatment position. Under virtual simulation the lumpectomy cavity, lumpectomy scar, nipple area and lung areas were outlined.  The patient then had setup of 2 custom-shaped tangential beams encompassing the right breast.  Forward planning will be used to improve the dose homogeneity.  Additional wedging will be used to improve dose homogeneity.  A computerized isodose plan will be generated for treatment.  TREATMENT PLAN:  The patient is to proceed with daily radiation treatments at 180 cGy per day for 28 treatments for a cumulative dose to the right breast to 5040 cGy.  The patient will then proceed with additional planning and continue with a boost field directed at the site of presentation in the lower outer aspect of the breast area and continue to a cumulative dose of 6040 cGy.  The patient will likely be treated with combination of 6 and 18 mV photons for her treatment.    ______________________________ Billie Lade, Ph.D., M.D. JDK/MEDQ  D:  11/05/2011  T:  11/05/2011  Job:  [id number]

## 2011-11-05 NOTE — Progress Notes (Signed)
Met with patient to discuss RO billing. Pt had some fin concerns and was given an EPP. Patient gave me the indication that she may or may not apply for addl asst. - It depends on if she really needs it or not.  Rad Tx: [address] Extrl Beam Attending Rad: Dr. Roselind Messier   Dx: 233.0 Carcinoma in situ of breast.

## 2011-11-07 ENCOUNTER — Other Ambulatory Visit: Payer: Self-pay | Admitting: *Deleted

## 2011-11-07 ENCOUNTER — Encounter: Payer: Self-pay | Admitting: *Deleted

## 2011-11-07 NOTE — Progress Notes (Signed)
11/07/11- Rn received call from Thelma Barge, financial counselor, with the pt's authorization code # [id number]. Lanora Manis said that she had spoken to a nurse named, Darel Hong at [employer].  Lanora Manis said that the authorization refers to the radiation therapy, not the research study since the study drug will be provided if the pt is randomized to that treatment arm.  The nurse's number is [phone].   After receiving this information, the research nurse registered the patient to the study.  She was successfully randomized to Treatment 2 - radiation therapy + trastuzumab x 2 doses.  Rn then notified the pharmacy that they will need to order enough study drug for dose 2.  Marilu Favre, Teacher, English as a foreign language, confirmed on 11/03/11 that the pharmacy had enough study drug for the first dose.  Rn also emailed both Dr. Roselind Messier and Dr. Donnie Coffin to notify them of the pt's randomization.  Rn referred Dr. Roselind Messier, radiation oncologist, to section 7.1 of the protocol regarding radiation therapy guidelines for B-43.  Rn then called the patient to notify her of her treatment arm.  She was anxious to hear that she will receive trastuzumab.   The pt said that she is not handling the diagnosis and her planned treatment very well.  She said that she needs something to take the "edge off".  She states she feels like she did when she went through her 3 year divorce.  Rn gave the pt emotional support.  Rn asked the pt if she wanted to be seen before her first treatment of trastuzumab (not required by the protocol) to discuss her concerns.  She said that she would be okay with being seen on 11/14/11 or with Dr. Donnie Coffin calling her.  Rn spoke to Dr. Donnie Coffin in person about the pt's issues.  He stated that she may need a referral to Enzo Bi, cancer center psychologist.  He stated that he would call her today at work.  Rn provided pt's work # to physician. Rn will check with the pt next week prior to her first treatment.

## 2011-11-11 ENCOUNTER — Ambulatory Visit
Admission: RE | Admit: 2011-11-11 | Discharge: 2011-11-11 | Payer: No Typology Code available for payment source | Source: Ambulatory Visit | Attending: Radiation Oncology | Admitting: Radiation Oncology

## 2011-11-11 ENCOUNTER — Other Ambulatory Visit: Payer: Self-pay | Admitting: *Deleted

## 2011-11-11 ENCOUNTER — Ambulatory Visit
Admission: RE | Admit: 2011-11-11 | Discharge: 2011-11-11 | Disposition: A | Discharge status: Home / Self Care | Payer: No Typology Code available for payment source | Source: Ambulatory Visit | Attending: Radiation Oncology | Admitting: Radiation Oncology

## 2011-11-11 DIAGNOSIS — D0510 Intraductal carcinoma in situ of unspecified breast: Secondary | ICD-10-CM

## 2011-11-11 MED ORDER — RADIAPLEXRX EX GEL
Freq: Once | CUTANEOUS | Status: AC
  Administered 2011-11-11: 170 via TOPICAL

## 2011-11-11 MED ORDER — ALRA NON-METALLIC DEODORANT (RAD-ONC)
1.0000 "application " | Freq: Once | TOPICAL | Status: AC
  Administered 2011-11-11: 1 via TOPICAL

## 2011-11-11 NOTE — Progress Notes (Signed)
[facility] Radiation Oncology Simulation Verification Note   Name: Dawn Barton MRN: [id number]   Date: @T @  DOB: Aug 07, 1966  Status:outpatient   DIAGNOSIS:  1. DCIS (ductal carcinoma in situ) of breast     POSITION: Patient was placed in the supine position on the treatment machine.  Isocenter and MLCS were reviewed and treatment was approved.  NARRATIVE: Patient tolerated simulation well.

## 2011-11-11 NOTE — Progress Notes (Signed)
Pt. Given Radiation Therapy and You Booklet, radiaplex and alra deodorant. Routine of clinic and doctor day reviewed. Instruction given on skin care as well as taking care of herself during  Treatment.

## 2011-11-12 ENCOUNTER — Telehealth: Payer: Self-pay | Admitting: Oncology

## 2011-11-12 ENCOUNTER — Ambulatory Visit
Admission: RE | Admit: 2011-11-12 | Discharge: 2011-11-12 | Disposition: A | Discharge status: Home / Self Care | Payer: No Typology Code available for payment source | Source: Ambulatory Visit | Attending: Radiation Oncology | Admitting: Radiation Oncology

## 2011-11-12 NOTE — Telephone Encounter (Signed)
lmonvm for pt re new appt for 2/1. Other appts remain the same. Pt to get new schedule when she comes in 1/11.

## 2011-11-13 ENCOUNTER — Ambulatory Visit: Payer: No Typology Code available for payment source

## 2011-11-13 ENCOUNTER — Ambulatory Visit
Admission: RE | Admit: 2011-11-13 | Discharge: 2011-11-13 | Disposition: A | Discharge status: Home / Self Care | Payer: No Typology Code available for payment source | Source: Ambulatory Visit | Attending: Radiation Oncology | Admitting: Radiation Oncology

## 2011-11-14 ENCOUNTER — Ambulatory Visit
Admission: RE | Admit: 2011-11-14 | Discharge: 2011-11-14 | Disposition: A | Discharge status: Home / Self Care | Payer: No Typology Code available for payment source | Source: Ambulatory Visit | Attending: Radiation Oncology | Admitting: Radiation Oncology

## 2011-11-14 ENCOUNTER — Ambulatory Visit (HOSPITAL_BASED_OUTPATIENT_CLINIC_OR_DEPARTMENT_OTHER): Payer: No Typology Code available for payment source

## 2011-11-14 ENCOUNTER — Encounter: Payer: Self-pay | Admitting: *Deleted

## 2011-11-14 ENCOUNTER — Encounter (INDEPENDENT_AMBULATORY_CARE_PROVIDER_SITE_OTHER): Payer: No Typology Code available for payment source | Admitting: Surgery

## 2011-11-14 ENCOUNTER — Other Ambulatory Visit: Payer: Self-pay | Admitting: Oncology

## 2011-11-14 DIAGNOSIS — D0590 Unspecified type of carcinoma in situ of unspecified breast: Secondary | ICD-10-CM

## 2011-11-14 DIAGNOSIS — C50919 Malignant neoplasm of unspecified site of unspecified female breast: Secondary | ICD-10-CM

## 2011-11-14 DIAGNOSIS — D0510 Intraductal carcinoma in situ of unspecified breast: Secondary | ICD-10-CM

## 2011-11-14 MED ORDER — TRASTUZUMAB CHEMO INJECTION 440 MG FOR NSABP B-43
8.0000 mg/kg | Freq: Once | INTRAVENOUS | Status: AC
  Administered 2011-11-14: 648 mg via INTRAVENOUS
  Filled 2011-11-14: qty 30.86

## 2011-11-14 MED ORDER — DIPHENHYDRAMINE HCL 25 MG PO CAPS
50.0000 mg | ORAL_CAPSULE | Freq: Once | ORAL | Status: AC
Start: 2011-11-14 — End: 2011-11-14
  Administered 2011-11-14: 50 mg via ORAL

## 2011-11-14 MED ORDER — ACETAMINOPHEN 325 MG PO TABS
650.0000 mg | ORAL_TABLET | Freq: Once | ORAL | Status: AC
  Administered 2011-11-14: 650 mg via ORAL

## 2011-11-14 MED ORDER — SODIUM CHLORIDE 0.9 % IV SOLN
Freq: Once | INTRAVENOUS | Status: AC
  Administered 2011-11-14: 10:00:00 via INTRAVENOUS

## 2011-11-14 NOTE — Progress Notes (Signed)
11/14/11- NSABP B-43- Cycle 1 study notes- The pt was into the cancer center today for her 3rd treatment in radiation and her first trastuzumab dose on the B-43 study.  The pt states that she is feeling fine with no adverse events.  The pt was given her appointment date and time for her 2nd dose of trastuzumab on 12/05/11 (3 weeks after her 1st dose- per protocol guidelines).  The pt will be seen by Dr. Renelda Loma physician assistant before her 2nd dose of trastuzumab.  Research nurse gave Judeth Cornfield, infusion nurse, the sign for the trastuzumab infusion.  The pt had no questions about her treatment plan.  Rn will continue to follow the pt to monitor her adverse events.

## 2011-11-14 NOTE — Patient Instructions (Signed)
Pt tolerated tx well, no complaints, d/c'd with daughter.  Schedule given to pt.  SLJ

## 2011-11-17 ENCOUNTER — Telehealth: Payer: Self-pay | Admitting: *Deleted

## 2011-11-17 ENCOUNTER — Ambulatory Visit
Admission: RE | Admit: 2011-11-17 | Discharge: 2011-11-17 | Disposition: A | Discharge status: Home / Self Care | Payer: No Typology Code available for payment source | Source: Ambulatory Visit | Attending: Radiation Oncology | Admitting: Radiation Oncology

## 2011-11-17 NOTE — Telephone Encounter (Signed)
Spoke with patient and she reports "feeling horrible Friday evening through Saturday morning".  Was fine while receiving Herceptin.  At home she "felt funny, low grade fever of 99.4, chills shaking" so she laid down but couldn't rest.  Was up until 4:00am.  Ate a grilled cheese and laid down again.  Felt like she had the flu so she took ibuprofen at 8:00am.  Rested a few hours and has felt better.  Wishes she had taken ibuprofen sooner.  Reports she knew we mentioned a possibility of flu-like symptoms but she felt bad.  Reviewed how and when to call on-call providers.  Asked if she will be receiving the same dose with next treatment.  Received 8mg /kg on Friday.  Next Trazatumamb dose will be 6mg /kg.

## 2011-11-17 NOTE — Telephone Encounter (Signed)
Message copied by Augusto Garbe on Mon Nov 17, 2011 11:24 AM ------      Message from: Caren Griffins      Created: Fri Nov 14, 2011  3:38 PM      Regarding: chemo f/u call      Contact: [phone]       1st time herceptin, Dr. Donnie Coffin.

## 2011-11-18 ENCOUNTER — Ambulatory Visit
Admission: RE | Admit: 2011-11-18 | Discharge: 2011-11-18 | Disposition: A | Discharge status: Home / Self Care | Payer: No Typology Code available for payment source | Source: Ambulatory Visit | Attending: Radiation Oncology | Admitting: Radiation Oncology

## 2011-11-18 ENCOUNTER — Encounter: Payer: Self-pay | Admitting: Radiation Oncology

## 2011-11-18 DIAGNOSIS — C50919 Malignant neoplasm of unspecified site of unspecified female breast: Secondary | ICD-10-CM

## 2011-11-18 NOTE — Progress Notes (Signed)
[facility]    Radiation Oncology [address]     Maryln Gottron, M.D. [place], [place] [address]               Dawn Barton, M.D., Ph.D. Phone: [phone]      Molli Hazard A. Kathrynn Running, M.D. Fax: [phone]      Radene Gunning, M.D., Ph.D.         Lurline Hare, M.D.         Grayland Jack, M.D Weekly Treatment Management Note  Name: Dawn Barton     MRN: [id number]        CSN: [id number] Date: 11/18/2011      DOB: 1966-04-08  CC: No primary provider on file.         Cornett    Status: Outpatient  Diagnosis: The encounter diagnosis was Breast cancer.  Current Dose: 900  Current Fraction: 5  Planned Dose: 6040  Narrative: Dawn Barton was seen today for weekly treatment management. The chart was checked and port films  were reviewed. Pt had flu like symptoms after herceptin late last week.  Scheduled for one additional Herceptin in feb. 2013.  Pt is enrolled in NSABP B-43 protocol.  Review of patient's allergies indicates no known allergies. Current Outpatient Prescriptions  Medication Sig Dispense Refill  . Multiple Vitamin (MULTIVITAMIN) tablet Take 1 tablet by mouth daily. multivitaqmin with b, doesn't take on regular basis        Labs:  Lab Results  Component Value Date   WBC 5.8 10/20/2011   HGB 14.3 10/20/2011   HCT 41.2 10/20/2011   MCV 91.2 10/20/2011   PLT 210 10/20/2011   Lab Results  Component Value Date   CREATININE 0.73 10/20/2011   BUN 9 10/20/2011   NA 141 10/20/2011   K 4.0 10/20/2011   CL 105 10/20/2011   CO2 30 10/20/2011   Lab Results  Component Value Date   ALT 13 10/20/2011   AST 14 10/20/2011   BILITOT 0.2* 10/20/2011    Physical Examination:  vitals were not taken for this visit.   Wt Readings from Last 3 Encounters:  10/20/11 178 lb 9.6 oz (81.012 kg)  10/20/11 180 lb 3.2 oz (81.738 kg)  09/19/11 173 lb 4.8 oz (78.608 kg)     Lungs - Normal respiratory effort, chest expands symmetrically. Lungs are  clear to auscultation, no crackles or wheezes.  Heart has regular rhythm and rate  Abdomen is soft and non tender with normal bowel sounds Right breast shows mild erythema   Assessment:  Patient tolerating treatments well  Plan: Continue treatment per original radiation prescription

## 2011-11-18 NOTE — Progress Notes (Signed)
Here for weekly under treat  Visit. Mild color change with treatment which will probably go away during day.

## 2011-11-19 ENCOUNTER — Ambulatory Visit
Admission: RE | Admit: 2011-11-19 | Discharge: 2011-11-19 | Disposition: A | Discharge status: Home / Self Care | Payer: No Typology Code available for payment source | Source: Ambulatory Visit | Attending: Radiation Oncology | Admitting: Radiation Oncology

## 2011-11-20 ENCOUNTER — Ambulatory Visit
Admission: RE | Admit: 2011-11-20 | Discharge: 2011-11-20 | Disposition: A | Discharge status: Home / Self Care | Payer: No Typology Code available for payment source | Source: Ambulatory Visit | Attending: Radiation Oncology | Admitting: Radiation Oncology

## 2011-11-21 ENCOUNTER — Encounter (INDEPENDENT_AMBULATORY_CARE_PROVIDER_SITE_OTHER): Payer: Self-pay | Admitting: Surgery

## 2011-11-21 ENCOUNTER — Ambulatory Visit
Admission: RE | Admit: 2011-11-21 | Discharge: 2011-11-21 | Disposition: A | Discharge status: Home / Self Care | Payer: No Typology Code available for payment source | Source: Ambulatory Visit | Attending: Radiation Oncology | Admitting: Radiation Oncology

## 2011-11-21 ENCOUNTER — Ambulatory Visit (INDEPENDENT_AMBULATORY_CARE_PROVIDER_SITE_OTHER): Payer: No Typology Code available for payment source | Admitting: Surgery

## 2011-11-21 VITALS — BP 110/70 | HR 64 | Temp 99.2°F | Resp 12 | Ht 65.0 in | Wt 180.0 lb

## 2011-11-21 DIAGNOSIS — Z9889 Other specified postprocedural states: Secondary | ICD-10-CM

## 2011-11-21 NOTE — Progress Notes (Signed)
Dawn Barton    [id number] 11/21/2011    1965-11-27   CC: Post op  Right lumpectomy HPI: The patient returns for post op follow-up. She underwent a right lumpectomy  For DCIS. Over all she feels that she is doing well. She is starting radiation therapy.   PE: The incision is healing nicely and there is no evidence of infection or hematoma.    DATA REVIEWED: Pathology report showed LOW GRADE DCIS  Margin 0.7 mm  IMPRESSION: Low grade DCIS Patient doing well.   PLAN: Her next visit will be in Cont radiation therapy.

## 2011-11-21 NOTE — Patient Instructions (Signed)
Return 3 months

## 2011-11-24 ENCOUNTER — Ambulatory Visit
Admission: RE | Admit: 2011-11-24 | Discharge: 2011-11-24 | Disposition: A | Discharge status: Home / Self Care | Payer: No Typology Code available for payment source | Source: Ambulatory Visit | Attending: Radiation Oncology | Admitting: Radiation Oncology

## 2011-11-25 ENCOUNTER — Ambulatory Visit
Admission: RE | Admit: 2011-11-25 | Discharge: 2011-11-25 | Disposition: A | Discharge status: Home / Self Care | Payer: No Typology Code available for payment source | Source: Ambulatory Visit | Attending: Radiation Oncology | Admitting: Radiation Oncology

## 2011-11-25 DIAGNOSIS — C50919 Malignant neoplasm of unspecified site of unspecified female breast: Secondary | ICD-10-CM

## 2011-11-25 NOTE — Progress Notes (Signed)
Has completed 10 of 33 treatment to right breast no changes in skin over breast. Has some redness over upper chest.

## 2011-11-25 NOTE — Progress Notes (Signed)
[facility]    Radiation Oncology [address] [place]     Maryln Gottron, M.D. [place], [place] [address]               Billie Lade, M.D., Ph.D. Phone: [phone]      Molli Hazard A. Kathrynn Running, M.D. Fax: [phone]      Radene Gunning, M.D., Ph.D.         Lurline Hare, M.D.         Grayland Jack, M.D Weekly Treatment Management Note  Name: Dawn Barton     MRN: [id number]        CSN: [id number] Date: 11/25/2011      DOB: 07/21/1966  CC:             Status: Outpatient  Diagnosis: right breast cancer  Current Dose: 1800    Current Fraction: 10  Planned Dose: 6040  Narrative: Orlene Erm was seen today for weekly treatment management. The chart was checked and port films  were reviewed.  Scheduled for one additional Herceptin in feb. 2013.  Pt is enrolled in NSABP B-43 protocol. No complaints this week.  Review of patient's allergies indicates no known allergies. Current Outpatient Prescriptions  Medication Sig Dispense Refill  . Multiple Vitamin (MULTIVITAMIN) tablet Take 1 tablet by mouth daily. multivitaqmin with b, doesn't take on regular basis        Labs:  Lab Results  Component Value Date   WBC 5.8 10/20/2011   HGB 14.3 10/20/2011   HCT 41.2 10/20/2011   MCV 91.2 10/20/2011   PLT 210 10/20/2011   Lab Results  Component Value Date   CREATININE 0.73 10/20/2011   BUN 9 10/20/2011   NA 141 10/20/2011   K 4.0 10/20/2011   CL 105 10/20/2011   CO2 30 10/20/2011   Lab Results  Component Value Date   ALT 13 10/20/2011   AST 14 10/20/2011   BILITOT 0.2* 10/20/2011    Physical Examination: minimal erythema to skin of right breast.  Wt Readings from Last 3 Encounters:  11/21/11 180 lb (81.647 kg)  10/20/11 178 lb 9.6 oz (81.012 kg)  10/20/11 180 lb 3.2 oz (81.738 kg)     Lungs - Normal respiratory effort, chest expands symmetrically. Lungs are clear to auscultation, no crackles or wheezes.  Heart has regular rhythm and rate    Abdomen is soft and non tender with normal bowel sounds    Assessment:  Patient tolerating treatments well  Plan: Continue treatment per original radiation prescription

## 2011-11-26 ENCOUNTER — Ambulatory Visit
Admission: RE | Admit: 2011-11-26 | Discharge: 2011-11-26 | Disposition: A | Discharge status: Home / Self Care | Payer: No Typology Code available for payment source | Source: Ambulatory Visit | Attending: Radiation Oncology | Admitting: Radiation Oncology

## 2011-11-27 ENCOUNTER — Ambulatory Visit
Admission: RE | Admit: 2011-11-27 | Discharge: 2011-11-27 | Disposition: A | Discharge status: Home / Self Care | Payer: No Typology Code available for payment source | Source: Ambulatory Visit | Attending: Radiation Oncology | Admitting: Radiation Oncology

## 2011-11-28 ENCOUNTER — Ambulatory Visit
Admission: RE | Admit: 2011-11-28 | Discharge: 2011-11-28 | Disposition: A | Discharge status: Home / Self Care | Payer: No Typology Code available for payment source | Source: Ambulatory Visit | Attending: Radiation Oncology | Admitting: Radiation Oncology

## 2011-12-01 ENCOUNTER — Ambulatory Visit
Admission: RE | Admit: 2011-12-01 | Discharge: 2011-12-01 | Disposition: A | Discharge status: Home / Self Care | Payer: No Typology Code available for payment source | Source: Ambulatory Visit | Attending: Radiation Oncology | Admitting: Radiation Oncology

## 2011-12-01 ENCOUNTER — Ambulatory Visit: Payer: No Typology Code available for payment source

## 2011-12-01 NOTE — Progress Notes (Signed)
Pt seen for counseling. Blood drawn and sent to [facility] for BRCA1/BRCA2 testing.

## 2011-12-02 ENCOUNTER — Other Ambulatory Visit: Payer: Self-pay | Admitting: Oncology

## 2011-12-02 ENCOUNTER — Ambulatory Visit
Admission: RE | Admit: 2011-12-02 | Discharge: 2011-12-02 | Disposition: A | Discharge status: Home / Self Care | Payer: No Typology Code available for payment source | Source: Ambulatory Visit | Attending: Radiation Oncology | Admitting: Radiation Oncology

## 2011-12-02 VITALS — Wt 178.9 lb

## 2011-12-02 DIAGNOSIS — D0510 Intraductal carcinoma in situ of unspecified breast: Secondary | ICD-10-CM

## 2011-12-02 NOTE — Progress Notes (Signed)
DIAGNOSIS:  Right breast cancer.  NARRATIVE:  Dawn Barton is seen today for weekly assessment.  She has completed 15 out of 33 planned treatments directed at the right breast area (2700 cGy of a planned 6040 cGy).  The patient continues to tolerate her treatments quite well without any appreciable fatigue, pruritus, or discomfort in the breast area.  EXAMINATION:  The patient's weight is 178 pounds.  The neck and supraclavicular areas are free of adenopathy.  The axillary areas are free of adenopathy.  Examination of the lungs reveals them to be clear. The heart has a regular rhythm and rate.  Examination of the right breast area reveals some slight erythema.  There are no signs of infection in the breast area.  IMPRESSION/PLAN:  The patient is tolerating her radiation treatments well at this time.  The patient's radiation fields are setting up accurately.  The patient's radiation chart was checked today.  Plan is to continue with breast conservation therapy to a cumulative dose of 6040 cGy.  The patient will receive additional Herceptin as part of [facility]-43 later this week.    ______________________________ Dawn Barton, Ph.D., M.D. JDK/MEDQ  D:  12/02/2011  T:  12/02/2011  Job:  [id number]

## 2011-12-02 NOTE — Progress Notes (Signed)
Has completed 15 of 28 radiation treatments to right breast. Mild discoloration. No breaks in skin.

## 2011-12-03 ENCOUNTER — Ambulatory Visit
Admission: RE | Admit: 2011-12-03 | Discharge: 2011-12-03 | Disposition: A | Discharge status: Home / Self Care | Payer: No Typology Code available for payment source | Source: Ambulatory Visit | Attending: Radiation Oncology | Admitting: Radiation Oncology

## 2011-12-04 ENCOUNTER — Ambulatory Visit
Admission: RE | Admit: 2011-12-04 | Discharge: 2011-12-04 | Disposition: A | Discharge status: Home / Self Care | Payer: No Typology Code available for payment source | Source: Ambulatory Visit | Attending: Radiation Oncology | Admitting: Radiation Oncology

## 2011-12-05 ENCOUNTER — Ambulatory Visit (HOSPITAL_BASED_OUTPATIENT_CLINIC_OR_DEPARTMENT_OTHER): Payer: No Typology Code available for payment source | Admitting: Physician Assistant

## 2011-12-05 ENCOUNTER — Ambulatory Visit (HOSPITAL_BASED_OUTPATIENT_CLINIC_OR_DEPARTMENT_OTHER): Payer: No Typology Code available for payment source

## 2011-12-05 ENCOUNTER — Encounter: Payer: Self-pay | Admitting: *Deleted

## 2011-12-05 ENCOUNTER — Ambulatory Visit
Admission: RE | Admit: 2011-12-05 | Discharge: 2011-12-05 | Disposition: A | Discharge status: Home / Self Care | Payer: No Typology Code available for payment source | Source: Ambulatory Visit | Attending: Radiation Oncology | Admitting: Radiation Oncology

## 2011-12-05 VITALS — BP 109/68 | HR 65 | Temp 98.7°F | Ht 65.0 in | Wt 180.3 lb

## 2011-12-05 DIAGNOSIS — D0510 Intraductal carcinoma in situ of unspecified breast: Secondary | ICD-10-CM

## 2011-12-05 DIAGNOSIS — Z5112 Encounter for antineoplastic immunotherapy: Secondary | ICD-10-CM

## 2011-12-05 DIAGNOSIS — D0590 Unspecified type of carcinoma in situ of unspecified breast: Secondary | ICD-10-CM

## 2011-12-05 DIAGNOSIS — Z170 Estrogen receptor positive status [ER+]: Secondary | ICD-10-CM

## 2011-12-05 DIAGNOSIS — C50919 Malignant neoplasm of unspecified site of unspecified female breast: Secondary | ICD-10-CM

## 2011-12-05 MED ORDER — TRASTUZUMAB CHEMO INJECTION 440 MG FOR NSABP B-43
6.0000 mg/kg | Freq: Once | INTRAVENOUS | Status: AC
  Administered 2011-12-05: 486 mg via INTRAVENOUS
  Filled 2011-12-05: qty 23.14

## 2011-12-05 MED ORDER — ACETAMINOPHEN 325 MG PO TABS
650.0000 mg | ORAL_TABLET | Freq: Once | ORAL | Status: AC
  Administered 2011-12-05: 650 mg via ORAL

## 2011-12-05 NOTE — Progress Notes (Signed)
Hematology and Oncology Follow Up Visit  Dawn Barton [id number] July 02, 1966 46 y.o. 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  She underwent a right lumpectomy on 10/02/2011 which showed a low-grade DCIS, nuclear grade 1 with clear surgical margins. There were for a few foci of in situ carcinoma in and around the previous biopsy site. She is on active radiation therapy according to [facility] B-43 study, and she is due to receive her last Q3 week dose of Herceptin given concomitantly with her radiation today.  Interim History:      Stephen is seen today prior to her last Q3 week dose of Herceptin. She did receive an 8 mg/kg loading dose 3 weeks ago, she has been receiving radiation therapy under the care of Dr. Roselind Messier since.  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 She denies any unexplained fevers chills night sweats shortness of breath chest pain. No nausea emesis diarrhea or constipation issues.  She did experience some low-grade fevers reported 99.8, chills, and "flu like" symptoms the evening of her first Herceptin dosing. But it did completely abated by the following day. She has had no recurrent symptoms since.  A detailed review of systems is otherwise noncontributory as noted below.  Review of Systems: Constitutional:  no weight loss, fever, night sweats and feels well Eyes: No complaints ENT: No complaints Cardiovascular: no chest pain or dyspnea on exertion Respiratory: no cough, shortness of breath, or wheezing Neurological: no TIA or stroke symptoms Dermatological: negative Gastrointestinal: no abdominal pain, change in bowel habits, or black or bloody stools Genito-Urinary: no dysuria, trouble voiding, or hematuria Hematological and Lymphatic: negative Breast: negative Musculoskeletal: negative Remaining ROS  negative.  ECOG: 0  Medications:   I have reviewed the patient's current medications.  Current Outpatient Prescriptions  Medication Sig Dispense Refill  . Multiple Vitamin (MULTIVITAMIN) tablet Take 1 tablet by mouth daily. multivitaqmin with b, doesn't take on regular basis         Allergies: No Known Allergies  Physical Exam: Filed Vitals:   12/05/11 0858  BP: 109/68  Pulse: 65  Temp: 98.7 F (37.1 C)   HEENT:  Sclerae anicteric, conjunctivae pink.  Oropharynx clear.  No mucositis or candidiasis.   Nodes:  No cervical, supraclavicular, or axillary lymphadenopathy palpated.  Breast Exam: deferred Lungs:  Clear to auscultation bilaterally.  No crackles, rhonchi, or wheezes.   Heart:  Regular rate and rhythm.   Abdomen:  Soft, nontender.  Positive bowel sounds.  No organomegaly or masses palpated.   Musculoskeletal:  No focal spinal tenderness to palpation.  Extremities:  Benign.  No peripheral edema or cyanosis.   Skin:  Benign.   Neuro:  Nonfocal, alert and oriented x 3.   Lab Results: Lab Results  Component Value Date   WBC 5.8 10/20/2011   HGB 14.3 10/20/2011   HCT 41.2 10/20/2011   MCV 91.2 10/20/2011   PLT 210 10/20/2011   NEUTROABS 3.5 10/20/2011     Chemistry      Component Value Date/Time   NA 141 10/20/2011 1622   K 4.0 10/20/2011 1622   CL 105 10/20/2011 1622   CO2 30 10/20/2011 1622   BUN 9 10/20/2011 1622   CREATININE 0.73 10/20/2011 1622      Component Value Date/Time   CALCIUM 9.6 10/20/2011 1622   ALKPHOS  59 10/20/2011 1622   AST 14 10/20/2011 1622   ALT 13 10/20/2011 1622   BILITOT 0.2* 10/20/2011 1622        Assessment:    Mikaela is a 46 year old woman from [place] with a history of low-grade DCIS, 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  She underwent a right lumpectomy on 10/02/2011 which showed a low-grade DCIS, nuclear grade 1 with clear surgical margins. There were for a few foci of in situ carcinoma in and  around the previous biopsy site. She is on active radiation therapy according to [facility] B-43 study, and she is due to receive her last Q3 week dose of Herceptin given concomitantly with her radiation today.   Case reviewed with Dr. Pierce Crane.  Plan:     Magaret will receive Herceptin today as scheduled, Benadryl has been omitted from her premeds due to significant lethargy and fatigue associated with the first dose. She will monitor for any fever like symptoms following this dose, but I suspect that it was an isolated occurrence as reported above. She will complete her radiation on 12/26/2011, she will see Dr. Donnie Coffin on 01/23/2012 for followup to discuss adjuvant hormonal therapy she knows to contact us prior if the need should arise.   This plan was reviewed with the patient, who voices understanding and agreement.  She knows to call with any changes or problems.    Raiquan Chandler T, PA-C 12/05/2011

## 2011-12-05 NOTE — Progress Notes (Signed)
12/05/11 r 11:03am - NSABP B-43 study notes- The pt was into the cancer center this am for her radiation treatment.  The pt was also seen before her scheduled 2nd dose of trastuzumab today.  The pt was seen and examined by Debbora Presto, PA (Dr. Renelda Loma physician assistant) today.  The pt described to [facility] her symptoms following her first dose of trastuzumab.  The PA explained that today's dose would be smaller than the loading dose she received on 11/14/11.  The pt denies any problems now.  She is working again and has started back running.  She is training to run a 10K race for breast cancer.  She specifically denies any fevers, chills, dyspnea, swelling and GI symptoms.  She reports a good appetite.  She is tolerating her radiation treatments well.  She was seen by Dr. Roselind Messier, radiation oncologist, on 12/02/11 for her weekly RT evaluation.  The pt is aware that she will be seen 30 days following completion of her radiation.  The research nurse notified the CCD at [facility] and informed them that 30 days following her radiation will fall on a Sunday.  The CCD said that it was acceptable for Dr. Donnie Coffin to see the pt on 01/23/12 (Day 28).  The pt is aware of these appts.  Rn gave Archie Patten, infusion nurse, the sign regarding today's trastuzumab dose.  The pt requested to receive on Tylenol as a premedication today.  She drove herself her and did not want any Benadryl.  The protocol does not specify any premedications for the trastuzumab doses.

## 2011-12-08 ENCOUNTER — Ambulatory Visit
Admission: RE | Admit: 2011-12-08 | Discharge: 2011-12-08 | Disposition: A | Discharge status: Home / Self Care | Payer: No Typology Code available for payment source | Source: Ambulatory Visit | Attending: Radiation Oncology | Admitting: Radiation Oncology

## 2011-12-09 ENCOUNTER — Ambulatory Visit
Admission: RE | Admit: 2011-12-09 | Discharge: 2011-12-09 | Disposition: A | Discharge status: Home / Self Care | Payer: No Typology Code available for payment source | Source: Ambulatory Visit | Attending: Radiation Oncology | Admitting: Radiation Oncology

## 2011-12-09 ENCOUNTER — Encounter: Payer: Self-pay | Admitting: Radiation Oncology

## 2011-12-09 DIAGNOSIS — D0510 Intraductal carcinoma in situ of unspecified breast: Secondary | ICD-10-CM

## 2011-12-09 NOTE — Progress Notes (Signed)
Here for weekly undertreat visit of right breast. Mild discoloration. No breaks in skin. Has some tenderness only.

## 2011-12-09 NOTE — Progress Notes (Signed)
DIAGNOSIS:  Intraductal carcinoma of the right breast.  NARRATIVE:  Dawn Barton is seen today for weekly assessment.  She has completed 20 out of 33 planned treatments directed at the right breast area (3600 cGy of a planned 6040 cGy).  The patient continues to tolerate her treatments quite well.  She denies any itching or discomfort in the breast area.  She also denies any fatigue.  The patient did receive her 2nd cycle of Herceptin late last week and tolerated this well without side effects.  EXAMINATION:  The lungs are clear.  The heart has a regular rhythm and rate.  The abdomen is soft and nontender with normal bowel sounds. There is no palpable supraclavicular or axillary adenopathy. Examination of the right breast area reveals some hyperpigmentation changes and mild erythema, but overall no significant skin reaction at this point.  IMPRESSION/PLAN:  The patient is tolerating her radiation treatments well at this time.  The patient's radiation fields are setting up accurately.  The patient's radiation chart was checked today.  Plan is to continue to a cumulative dose of 6040 cGy as breast conservation therapy.    ______________________________ Billie Lade, Ph.D., M.D. JDK/MEDQ  D:  12/09/2011  T:  12/09/2011  Job:  [id number]

## 2011-12-10 ENCOUNTER — Ambulatory Visit
Admission: RE | Admit: 2011-12-10 | Discharge: 2011-12-10 | Disposition: A | Discharge status: Home / Self Care | Payer: No Typology Code available for payment source | Source: Ambulatory Visit | Attending: Radiation Oncology | Admitting: Radiation Oncology

## 2011-12-11 ENCOUNTER — Ambulatory Visit
Admission: RE | Admit: 2011-12-11 | Discharge: 2011-12-11 | Disposition: A | Discharge status: Home / Self Care | Payer: No Typology Code available for payment source | Source: Ambulatory Visit | Attending: Radiation Oncology | Admitting: Radiation Oncology

## 2011-12-11 ENCOUNTER — Ambulatory Visit: Admission: RE | Admit: 2011-12-11 | Payer: No Typology Code available for payment source | Source: Ambulatory Visit

## 2011-12-12 ENCOUNTER — Ambulatory Visit
Admission: RE | Admit: 2011-12-12 | Discharge: 2011-12-12 | Disposition: A | Discharge status: Home / Self Care | Payer: No Typology Code available for payment source | Source: Ambulatory Visit | Attending: Radiation Oncology | Admitting: Radiation Oncology

## 2011-12-12 DIAGNOSIS — D0510 Intraductal carcinoma in situ of unspecified breast: Secondary | ICD-10-CM

## 2011-12-12 DIAGNOSIS — C50919 Malignant neoplasm of unspecified site of unspecified female breast: Secondary | ICD-10-CM

## 2011-12-12 MED ORDER — RADIAPLEXRX EX GEL
Freq: Once | CUTANEOUS | Status: AC
  Administered 2011-12-12: 10:00:00 via TOPICAL

## 2011-12-15 ENCOUNTER — Ambulatory Visit
Admission: RE | Admit: 2011-12-15 | Discharge: 2011-12-15 | Disposition: A | Discharge status: Home / Self Care | Payer: No Typology Code available for payment source | Source: Ambulatory Visit | Attending: Radiation Oncology | Admitting: Radiation Oncology

## 2011-12-16 ENCOUNTER — Ambulatory Visit
Admission: RE | Admit: 2011-12-16 | Discharge: 2011-12-16 | Disposition: A | Discharge status: Home / Self Care | Payer: No Typology Code available for payment source | Source: Ambulatory Visit | Attending: Radiation Oncology | Admitting: Radiation Oncology

## 2011-12-16 VITALS — Wt 179.2 lb

## 2011-12-16 DIAGNOSIS — D0510 Intraductal carcinoma in situ of unspecified breast: Secondary | ICD-10-CM

## 2011-12-16 NOTE — Progress Notes (Signed)
DIAGNOSIS:  Right breast cancer.  On December 15, 2011, Mrs. Wilner underwent additional planning for radiation therapy directed at the right breast area.  The patient's treatment planning CT scan was reviewed and the patient subsequently had setup of a custom electron cutout field directed at the site of presentation in the lower outer quadrant of the right breast.  The patient will be treated 15 MeV electrons prescribed to the 95% isodose line.  PLAN:  The patient is to receive 5 additional treatments at 200 cGy per day for an additional dose of 1000 cGy and a cumulative dose of 6040 cGy.  A special port plan is requested for treatment.    ______________________________ Billie Lade, Ph.D., M.D. JDK/MEDQ  D:  12/16/2011  T:  12/16/2011  Job:  [id number]

## 2011-12-16 NOTE — Progress Notes (Signed)
DIAGNOSIS:  Right breast cancer.  NARRATIVE:  Dawn Barton is seen today for weekly assessment.  She has completed 25 of 33 planned treatments directed at the right breast area. She is starting to have some discomfort in the upper outer aspect of the breast which did interfere with some sleeping over the weekend.  The patient is not requiring any pain medicine at this time, but I did offer this as option for her.  EXAMINATION:  The patient's lungs are clear.  The heart has a regular rhythm and rate.  Examination of the right breast area reveals some hyperpigmentation changes and erythema.  There is more erythema noted in the upper outer aspect of the breast with some dry desquamation, but no moist desquamation.  IMPRESSION/PLAN:  The patient is tolerating her treatments reasonably well except for issues as above.  The patient's radiation fields are setting up accurately.  The patient's radiation chart was checked today. In light of the patient's skin reaction, she will be given a Hydrogel dressing to place in the upper outer aspect of the breast.  Plan is to continue to a cumulative dose of 6040 cGy.    ______________________________ Billie Lade, Ph.D., M.D. JDK/MEDQ  D:  12/16/2011  T:  12/16/2011  Job:  [id number]

## 2011-12-16 NOTE — Progress Notes (Signed)
Here for weekly under treat visit. Has completed 25/28 of right breast . Axilla fold area reddened and painful. Will give hydrogel pad to try. No breaks in skin.

## 2011-12-17 ENCOUNTER — Ambulatory Visit
Admission: RE | Admit: 2011-12-17 | Discharge: 2011-12-17 | Disposition: A | Discharge status: Home / Self Care | Payer: No Typology Code available for payment source | Source: Ambulatory Visit | Attending: Radiation Oncology | Admitting: Radiation Oncology

## 2011-12-17 ENCOUNTER — Ambulatory Visit: Payer: No Typology Code available for payment source | Admitting: Radiation Oncology

## 2011-12-18 ENCOUNTER — Ambulatory Visit
Admission: RE | Admit: 2011-12-18 | Discharge: 2011-12-18 | Disposition: A | Discharge status: Home / Self Care | Payer: No Typology Code available for payment source | Source: Ambulatory Visit | Attending: Radiation Oncology | Admitting: Radiation Oncology

## 2011-12-19 ENCOUNTER — Ambulatory Visit
Admission: RE | Admit: 2011-12-19 | Discharge: 2011-12-19 | Disposition: A | Discharge status: Home / Self Care | Payer: No Typology Code available for payment source | Source: Ambulatory Visit | Attending: Radiation Oncology | Admitting: Radiation Oncology

## 2011-12-22 ENCOUNTER — Ambulatory Visit
Admission: RE | Admit: 2011-12-22 | Discharge: 2011-12-22 | Disposition: A | Discharge status: Home / Self Care | Payer: No Typology Code available for payment source | Source: Ambulatory Visit | Attending: Radiation Oncology | Admitting: Radiation Oncology

## 2011-12-23 ENCOUNTER — Ambulatory Visit
Admission: RE | Admit: 2011-12-23 | Discharge: 2011-12-23 | Disposition: A | Discharge status: Home / Self Care | Payer: No Typology Code available for payment source | Source: Ambulatory Visit | Attending: Radiation Oncology | Admitting: Radiation Oncology

## 2011-12-23 DIAGNOSIS — D0510 Intraductal carcinoma in situ of unspecified breast: Secondary | ICD-10-CM

## 2011-12-23 NOTE — Progress Notes (Signed)
DIAGNOSIS:  Right breast cancer.  NARRATIVE:  [name] is seen today for weekly assessment.  She has completed 5440 cGy of a planned 6040 cGy directed at the right breast area.  Patient over weekend developed significant discomfort in the upper aspect of the breast area.  The patient is having some fatigue which she relates a lot to head cold.  PHYSICAL EXAMINATION:  There are hyperpigmentation changes and erythema throughout the breast and a small amount of moist desquamation in the upper outer aspect of the breast area.  The lungs are clear.  The heart has a regular rhythm and rate.  IMPRESSION AND PLAN:  The patient is tolerating her treatments reasonably well except for issues as above.  The patient's radiation fields are setting up accurately.  The patient's radiation chart was checked today.  Plan is to continue to a cumulative dose of 6040 cGy. The patient's moist desquamation she has been given Neosporin to place on this area.  I have also advised her to consider Neosporin Plus to help with some of her discomfort.  The patient will be seen again later this week on her final day of treatment, Friday, February 22nd.    ______________________________ Billie Lade, Ph.D., M.D. JDK/MEDQ  D:  12/23/2011  T:  12/23/2011  Job:  [id number]

## 2011-12-23 NOTE — Progress Notes (Signed)
Here for routine weekly under treat visit. Pt. developed blister  Of left axilla over the week -end which has dried.To apply neosporin in this area  Until healed. Understand to continue application of radiaplex to left breast. Completes treatment on Friday will schedule for one month Follow up.

## 2011-12-24 ENCOUNTER — Ambulatory Visit
Admission: RE | Admit: 2011-12-24 | Discharge: 2011-12-24 | Disposition: A | Discharge status: Home / Self Care | Payer: No Typology Code available for payment source | Source: Ambulatory Visit | Attending: Radiation Oncology | Admitting: Radiation Oncology

## 2011-12-25 ENCOUNTER — Ambulatory Visit
Admission: RE | Admit: 2011-12-25 | Discharge: 2011-12-25 | Disposition: A | Discharge status: Home / Self Care | Payer: No Typology Code available for payment source | Source: Ambulatory Visit | Attending: Radiation Oncology | Admitting: Radiation Oncology

## 2011-12-26 ENCOUNTER — Ambulatory Visit
Admission: RE | Admit: 2011-12-26 | Discharge: 2011-12-26 | Disposition: A | Discharge status: Home / Self Care | Payer: No Typology Code available for payment source | Source: Ambulatory Visit | Attending: Radiation Oncology | Admitting: Radiation Oncology

## 2011-12-26 ENCOUNTER — Telehealth: Payer: Self-pay | Admitting: Genetic Counselor

## 2011-12-26 ENCOUNTER — Encounter: Payer: Self-pay | Admitting: Radiation Oncology

## 2011-12-26 DIAGNOSIS — C50919 Malignant neoplasm of unspecified site of unspecified female breast: Secondary | ICD-10-CM

## 2011-12-26 NOTE — Progress Notes (Signed)
Pt complets treatment today, follow up card 1 month with Dr. Roselind Messier given to patient, pt has erythema right breast, area that has peeled and patient using neosporin, and telfa dressing,  8:35 AM

## 2011-12-27 NOTE — Progress Notes (Signed)
[facility] Radiation Oncology Weekly Treatment Note    Name: Dawn Barton Date: 12/27/2011 MRN: [id number] DOB: [birth year]  Status: outpatient    Current dose: 6040  Current fraction:33  Planned dose:6040  Planned fraction:33   MEDICATIONS: Current Outpatient Prescriptions  Medication Sig Dispense Refill  . Multiple Vitamin (MULTIVITAMIN) tablet Take 1 tablet by mouth daily. multivitaqmin with b, doesn't take on regular basis          ALLERGIES: Review of patient's allergies indicates no known allergies.   LABORATORY DATA:  Lab Results  Component Value Date   WBC 5.8 10/20/2011   HGB 14.3 10/20/2011   HCT 41.2 10/20/2011   MCV 91.2 10/20/2011   PLT 210 10/20/2011   Lab Results  Component Value Date   NA 141 10/20/2011   K 4.0 10/20/2011   CL 105 10/20/2011   CO2 30 10/20/2011   Lab Results  Component Value Date   ALT 13 10/20/2011   AST 14 10/20/2011   ALKPHOS 59 10/20/2011   BILITOT 0.2* 10/20/2011      NARRATIVE: Dawn Barton was seen today for weekly treatment management. The chart was checked and port films images were reviewed. Pt completes today. Skin doing well except for area of moist desquamation in medial axilla.  PHYSICAL EXAMINATION: vitals were not taken for this visit.    Diffuse erythema, but skin looks good overall. Area of desquamation as described above.   ASSESSMENT: Completes treatment today. Followup in one month. Pt using neosporing and telfa dressing. Discussed possible use of [place] soaks as well. Should heal substantially in next couple of weeks.

## 2012-01-07 NOTE — Progress Notes (Signed)
CC:   Thomas A. Cornett, M.D. Pierce Crane, M.D., F.R.C.P.C. Maximiano Coss  DIAGNOSIS:  Intraductal carcinoma of the right breast.  INDICATION FOR THERAPY:  Breast conservation therapy.  TREATMENT DATES:  November 12, 2011, through December 26, 2011.  SITE/DOSE:  Right breast 5040 cGy in 28 fractions (180 cGy per fraction).  The site of presentation in the lower-outer quadrant of the right breast area was boosted further to a cumulative dose of 6040 cGy.  ENERGY/FIELD:  The patient was initially treated with tangential beams encompassing the right breast.  Forward planning was used to improve the dose homogeneity.  After 28 treatments, the patient underwent therapy directed at the site of presentation in the lower-outer aspect of the right breast.  The patient was treated with custom electron cutout field using 15 MeV electrons prescribed to the 95% isodose line.  NARRATIVE:  [name] tolerated her treatments reasonably well towards the initial part of her treatment.  Towards end of her therapy, however, she did experience some skin irritation and eventual moist desquamation in the upper-outer aspect of the breast area.  This responded well to Neosporin ointment.  The patient was treated on a NSABP B-43 study which included Herceptin given twice during the course of her radiation therapy.  FOLLOWUP APPOINTMENT:  1 month.    ______________________________ Billie Lade, Ph.D., M.D. JDK/MEDQ  D:  01/07/2012  T:  01/07/2012  Job:  [id number]

## 2012-01-12 ENCOUNTER — Encounter (INDEPENDENT_AMBULATORY_CARE_PROVIDER_SITE_OTHER): Payer: Self-pay | Admitting: Surgery

## 2012-01-20 ENCOUNTER — Encounter: Payer: Self-pay | Admitting: *Deleted

## 2012-01-21 ENCOUNTER — Encounter: Payer: Self-pay | Admitting: Oncology

## 2012-01-23 ENCOUNTER — Other Ambulatory Visit: Payer: No Typology Code available for payment source | Admitting: Lab

## 2012-01-23 ENCOUNTER — Encounter: Payer: Self-pay | Admitting: *Deleted

## 2012-01-23 ENCOUNTER — Encounter: Payer: Self-pay | Admitting: Radiation Oncology

## 2012-01-23 ENCOUNTER — Ambulatory Visit (HOSPITAL_BASED_OUTPATIENT_CLINIC_OR_DEPARTMENT_OTHER): Payer: No Typology Code available for payment source | Admitting: Oncology

## 2012-01-23 VITALS — BP 107/68 | HR 83 | Temp 98.5°F | Ht 65.0 in | Wt 178.6 lb

## 2012-01-23 DIAGNOSIS — C50919 Malignant neoplasm of unspecified site of unspecified female breast: Secondary | ICD-10-CM

## 2012-01-23 DIAGNOSIS — D0590 Unspecified type of carcinoma in situ of unspecified breast: Secondary | ICD-10-CM

## 2012-01-23 DIAGNOSIS — Z170 Estrogen receptor positive status [ER+]: Secondary | ICD-10-CM

## 2012-01-23 MED ORDER — TAMOXIFEN CITRATE 20 MG PO TABS: 20.0000 mg | ORAL_TABLET | Freq: Every day | ORAL | Status: AC

## 2012-01-23 NOTE — Progress Notes (Signed)
Patient History and Physical   Dawn Barton [id number] [birth year] 46 y.o. 01/23/2012  CC: Dr Marca Ancona, dr Christen Bame  Chief Complaint: DCIS  HPI:  This is a 46 year old woman from [place] in previous good health , who presents with an abnormal mammogram. She has undergone annual mammography. This was initially performed in [place]. A cluster of right breast calcifications were seen in the lower outer quadrant of the right breast. 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 The patient underwent lumpectomy on 10/02/2011. This showed low-grade DCIS, grade 1, with clear surgical margins. There were a few foci of in situ carcinoma ranging from 0.1 2.3 cm in and around the previous biopsy cavity. The patient had an unremarkable postoperative course. She has completed xrt 12/26/11.  PMH: Past Medical History  Diagnosis Date  . Cancer     breast - right  . Complication of anesthesia     slow to wake up  . 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 Radiation 11/12/2011-12/26/2011    5040 cGy    Past Surgical History  Procedure Date  . Tubal ligation   . Breast surgery 2003    breast lift  . Breast lumpectomy 10/02/2011    Procedure: LUMPECTOMY;  Surgeon: Maisie Fus A. Cornett, MD;  Location: [facility];  Service: General;  Laterality: Right;  right breast lumpectomy  G2P2, menarche 11, menopause 41, brief BCP use , no HRT. Continues to be amenorrheic. No significant  vasovagal symptoms. Hx TL  Allergies: No Known Allergies  Medications: Medications Prior to Admission  Medication Sig Dispense Refill  . Multiple Vitamin (MULTIVITAMIN) tablet Take 1 tablet by mouth daily. multivitaqmin with b, doesn't take on regular basis        No current facility-administered medications on file as of 01/23/2012.    Social History:     reports that she has quit smoking. She has never used smokeless tobacco. She reports that she drinks alcohol. She reports that she does not use illicit drugs.  the patient has been married twice first time 6 years second time for 15 years. She has a daughter age 31, who attends [employer] in [place]. She has a second daughter is 40 also 36-year-old child. 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 Her husband works for [employer].Both parents are A+W, she has 1 brother in good health.  Family History: No family history on file.  no other history of breast cancer in the family. Her mother has undergone a number of lumpectomies for fibrocystic disease.   Review of Systems: Constitutional ROS: Fever no, , Chills, Night Sweats, Anorexia, Pain , no Cardiovascular ROS: no chest pain or dyspnea on exertion Respiratory ROS: no cough, shortness of breath, or wheezing Neurological ROS: no TIA or stroke symptoms Dermatological ROS: negative ENT ROS: negative Gastrointestinal ROS: no abdominal pain, change in bowel habits, or black or bloody stools Genito-Urinary ROS: no dysuria, trouble voiding, or hematuria Hematological and Lymphatic ROS: negative Breast ROS: negative for breast lumps Musculoskeletal ROS: negative Remaining ROS negative.  Physical Exam: Blood pressure 107/68, pulse 83, temperature 98.5 F (36.9 C), temperature source Oral, height 5\' 5"  (1.651 m), weight 178 lb 9.6 oz (81.012 kg). General appearance: alert, cooperative and appears stated age Head: Normocephalic, without obvious  abnormality, atraumatic Neck: no adenopathy, no carotid bruit, no JVD, supple, symmetrical, trachea midline and thyroid not enlarged, symmetric, no tenderness/mass/nodules Lymph nodes: Cervical, supraclavicular, and axillary nodes normal. Cardiac : Normal   pulmonary: Normal breath sounds Breasts: Right breast status post  biopsy and lumpectomy, no obvious masses; left breast normal; both axilla normal Abdomen: No organomegaly or masses Extremities: No cyanosis clubbing or edema Neuro: Grossly normal   Lab Results: Lab Results  Component Value Date   WBC 5.8 10/20/2011   HGB 14.3 10/20/2011   HCT 41.2 10/20/2011   MCV 91.2 10/20/2011   PLT 210 10/20/2011     Chemistry      Component Value Date/Time   NA 141 10/20/2011 1622   K 4.0 10/20/2011 1622   CL 105 10/20/2011 1622   CO2 30 10/20/2011 1622   BUN 9 10/20/2011 1622   CREATININE 0.73 10/20/2011 1622      Component Value Date/Time   CALCIUM 9.6 10/20/2011 1622   ALKPHOS 59 10/20/2011 1622   AST 14 10/20/2011 1622   ALT 13 10/20/2011 1622   BILITOT 0.2* 10/20/2011 1622       Radiological Studies: n/a  Impression and Plan: Postmenopausal  Woman with history of ER positive, PR positive DCIS status post excision. We had a 50 minute discussion regarding management of her DCIS. She is ER/PR positive she is a good candidate for tamoxifen therapy. We discussed side effects of tamoxifen. We also discussed enrollment in the B. 43 study. This is a randomized study for those patients with HER-2 positive DCIS. For this patient for HER-2 positive there is an opportunity to receive 2 doses of Herceptin prior to receiving radiation therapy. I will have the research nurse contact the patient for a further discussion of this option. The patient is also under 50 it should be referred for genetic testing given that she does not have a positive family history for breast cancer or ovarian cancer. I will see the patient back in followup after she has completed radiation therapy. I suspect her positive remission and/or cure are quite high.     Pierce Crane, MD 01/23/2012, 2:14 PM

## 2012-01-23 NOTE — Progress Notes (Signed)
01/23/12 - NSABP B-43 - 30 days after completion of RT study notes- The pt was into the cancer center today for her 30 days after completion of radiation therapy appointment to assess her toxicities.  The pt reports that she is feeling great with no adverse events at present.  The pt was seen and examined today by Dr. Donnie Coffin.  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  ECOG=0.  Dr. Donnie Coffin went over the use of tamoxifen with the pt, and she agreed to start tamoxifen to further reduce her risk of a DCIS recurrence.  He sent her prescription to her pharmacy in [place], [place].  The pt was instructed about all of the side effects of tamoxifen.  The research nurse also went over her toxicities that occurred while on-study.  She states that her "flu-like symptoms" that she experienced after her first trastuzumab was "moderate".  Flu-like symptoms-grade 2.  She said the chills associated with her "flu-like symptom" was mild, and her fever was only 99 (no grade).  She also complained of fatigue, but the pt said that she was able to work and exercise during all of her radiation.  Fatigue-grade 1.  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  The pt said that she did not have any skin infections or wounds associated with her radiation.  The pt is aware that she will need to be seen in July 2013 for follow up and her mammogram is due in the fall.  Dr. Donnie Coffin said he will order her mammogram for August 2013 as a new baseline.  The pt will see Dr. Roselind Messier, radiation oncologist, on Monday, 01/26/12.    01/26/12 at 10:58am- The pt was into the cancer center this am to see Dr. Roselind Messier for her 1 month follow-up after completion of her radiation.  She reports that her skin has healed nicely.  She denies any skin or breast problems.  Dr. Roselind Messier confirmed that the pt experienced a grade 2 dermatitis radiation.  The pt reported that she began her tamoxifen on 01/24/12.  The pt is aware that  she will need to be followed every 6 months.

## 2012-01-26 ENCOUNTER — Encounter: Payer: Self-pay | Admitting: Radiation Oncology

## 2012-01-26 ENCOUNTER — Telehealth: Payer: Self-pay | Admitting: *Deleted

## 2012-01-26 ENCOUNTER — Encounter: Payer: Self-pay | Admitting: Oncology

## 2012-01-26 ENCOUNTER — Ambulatory Visit
Admission: RE | Admit: 2012-01-26 | Discharge: 2012-01-26 | Disposition: A | Discharge status: Home / Self Care | Payer: No Typology Code available for payment source | Source: Ambulatory Visit | Attending: Radiation Oncology | Admitting: Radiation Oncology

## 2012-01-26 VITALS — BP 98/61 | HR 75 | Temp 97.8°F | Wt 180.3 lb

## 2012-01-26 DIAGNOSIS — D0510 Intraductal carcinoma in situ of unspecified breast: Secondary | ICD-10-CM

## 2012-01-26 NOTE — Telephone Encounter (Signed)
gave patient appointment for 05-27-2012 starting at 9:30am

## 2012-01-26 NOTE — Progress Notes (Signed)
CC:   Thomas A. Cornett, M.D. Pierce Crane, M.D., F.R.C.P.C. Maximiano Coss  DIAGNOSIS:  Intraductal carcinoma of the right breast.  INTERVAL SINCE RADIATION THERAPY:  1 month.  NARRATIVE:  Dawn Barton comes in today for routine followup.  She completed her therapy December 26, 2011.  The patient denies any residual fatigue, itching or discomfort in the breast area.  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  The patient denies any nipple discharge or bleeding.  She continues to be followed by the protocol office after enrolling in B-43.  The patient received 2 doses of Herceptin as part of this protocol.  PHYSICAL EXAMINATION:  Vital Signs:  The patient's weight is 188 pounds. Temperature is 97.8, pulse 85, blood pressure is 98/61.  Examination of the neck and supraclavicular region reveals no evidence of adenopathy. The axillary areas are free of adenopathy.  Examination of the lungs reveals them to be clear.  Heart:  Regular rhythm and rate.  The examination of the left breast reveals it to be large and pendulous without mass or nipple discharge.  Examination the right breast area reveals some mild hyperpigmentation changes.  The patient's skin has overall healed well.  There is some mild induration at the lumpectomy scar but no dominant masses appreciated in the breast.  No nipple discharge or bleeding.  IMPRESSION AND PLAN:  Clinically no evidence of disease.  The patient will return for routine followup in 6 months.    ______________________________ Dawn Barton, Ph.D., M.D. JDK/MEDQ  D:  01/26/2012  T:  01/26/2012  Job:  [id number]

## 2012-01-26 NOTE — Progress Notes (Signed)
Patient came by today to get a copy of last office visit with Dr. Donnie Coffin.

## 2012-01-26 NOTE — Progress Notes (Signed)
Here for routine followup post one month completion of radiation to right breast. Skin has healed nicely.No concerns voiced. Has started tamoxifen.

## 2012-02-20 ENCOUNTER — Encounter (INDEPENDENT_AMBULATORY_CARE_PROVIDER_SITE_OTHER): Payer: Self-pay

## 2012-02-20 ENCOUNTER — Ambulatory Visit (INDEPENDENT_AMBULATORY_CARE_PROVIDER_SITE_OTHER): Payer: No Typology Code available for payment source | Admitting: Surgery

## 2012-02-20 ENCOUNTER — Encounter (INDEPENDENT_AMBULATORY_CARE_PROVIDER_SITE_OTHER): Payer: Self-pay | Admitting: Surgery

## 2012-02-20 VITALS — BP 98/64 | HR 80 | Temp 97.6°F | Resp 16 | Ht 66.0 in | Wt 181.0 lb

## 2012-02-20 DIAGNOSIS — Z853 Personal history of malignant neoplasm of breast: Secondary | ICD-10-CM

## 2012-02-20 NOTE — Patient Instructions (Signed)
Follow up 1 year 

## 2012-02-20 NOTE — Progress Notes (Signed)
NAME: Shade L Bagent       DOB: 24-Sep-1966           DATE: 02/20/2012       MRN: [id number]   Dawn Barton is a 46 y.o.Marland Kitchenfemale who presents for routine followup of her  low grade left DCIS diagnosed in 10/ 2012 and treated with LUMPECTOMY . She has no problems or concerns on either side.She is enrolled in a study.   PFSH: She has had no significant changes since the last visit here.  ROS: There have been no significant changes since the last visit here  EXAM: General: The patient is alert, oriented, generally healty appearing, NAD. Mood and affect are normal.  Breasts:  min radiation changes on left no masses bilaterally  Lymphatics: She has no axillary or supraclavicular adenopathy on either side.  Extremities: Full ROM of the surgical side with no lymphedema noted.  Data Reviewed: Dr Roselind Messier and Donnie Coffin notes  Impression: Doing well, with no evidence of recurrent cancer or new cancer  Plan: Will continue to follow up on an annual basis here.

## 2012-05-27 ENCOUNTER — Other Ambulatory Visit (HOSPITAL_BASED_OUTPATIENT_CLINIC_OR_DEPARTMENT_OTHER): Payer: No Typology Code available for payment source | Admitting: Lab

## 2012-05-27 ENCOUNTER — Telehealth: Payer: Self-pay | Admitting: *Deleted

## 2012-05-27 ENCOUNTER — Ambulatory Visit (HOSPITAL_BASED_OUTPATIENT_CLINIC_OR_DEPARTMENT_OTHER): Payer: No Typology Code available for payment source | Admitting: Oncology

## 2012-05-27 VITALS — BP 109/68 | HR 59 | Temp 98.6°F | Ht 66.0 in | Wt 175.0 lb

## 2012-05-27 DIAGNOSIS — D0590 Unspecified type of carcinoma in situ of unspecified breast: Secondary | ICD-10-CM

## 2012-05-27 DIAGNOSIS — D0510 Intraductal carcinoma in situ of unspecified breast: Secondary | ICD-10-CM

## 2012-05-27 LAB — CBC WITH DIFFERENTIAL/PLATELET
Basophils Absolute: 0 10*3/uL (ref 0.0–0.1)
EOS%: 2.4 % (ref 0.0–7.0)
HGB: 13.9 g/dL (ref 11.6–15.9)
MCH: 31.9 pg (ref 25.1–34.0)
MCV: 92.4 fL (ref 79.5–101.0)
MONO%: 12 % (ref 0.0–14.0)
RBC: 4.36 10*6/uL (ref 3.70–5.45)
RDW: 14.1 % (ref 11.2–14.5)

## 2012-05-27 LAB — COMPREHENSIVE METABOLIC PANEL
AST: 14 U/L (ref 0–37)
Albumin: 4.5 g/dL (ref 3.5–5.2)
Alkaline Phosphatase: 44 U/L (ref 39–117)
BUN: 9 mg/dL (ref 6–23)
Potassium: 3.9 mEq/L (ref 3.5–5.3)
Total Bilirubin: 0.4 mg/dL (ref 0.3–1.2)

## 2012-05-27 NOTE — Progress Notes (Signed)
Hematology and Oncology Follow Up Visit  CYNDIA DEGRAFF [id number] Jan 27, 1966 46 y.o. 05/27/2012 10:33 AM   DIAGNOSIS:   Encounter Diagnosis  Name Primary?  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 Previously enrolled on B. 43 study on Herceptin arm.    Interim History:Since being seen last she has been doing well. She is on tamoxifen and tolerates it well without any side effects. She continues to work full-time. Hot flashes of increased a little bit better and certainly tolerable. She is due for a followup mammogram in August which will be her first posttreatment.  Medications: I have reviewed the patient's current medications.  Allergies: No Known Allergies  Past Medical History, Surgical history, Social history, and Family History were reviewed and updated.  Review of Systems: Constitutional:  Negative for fever, chills, night sweats, anorexia, weight loss, pain. Cardiovascular: no chest pain or dyspnea on exertion Respiratory: no cough, shortness of breath, or wheezing Neurological: negative Dermatological: negative ENT: negative Skin Gastrointestinal: negative Genito-Urinary: negative Hematological and Lymphatic: negative Breast: negative Musculoskeletal: negative Remaining ROS negative.  Physical Exam:  Blood pressure 109/68, pulse 59, temperature 98.6 F (37 C), height 5\' 6"  (1.676 m), weight 175 lb (79.379 kg).  ECOG: 0  HEENT:  Sclerae anicteric, conjunctivae pink.  Oropharynx clear.  No mucositis or candidiasis.  Nodes:  No cervical, supraclavicular, or axillary lymphadenopathy palpated.  Breast Exam:  Right breast is benign.  No masses, discharge, skin change, or nipple inversion.  Left breast is benign.  No masses, discharge, skin change, or nipple inversion..  Lungs:  Clear to  auscultation bilaterally.  No crackles, rhonchi, or wheezes.  Heart:  Regular rate and rhythm.  Abdomen:  Soft, nontender.  Positive bowel sounds.  No organomegaly or masses palpated.  Musculoskeletal:  No focal spinal tenderness to palpation.  Extremities:  Benign.  No peripheral edema or cyanosis.  Skin:  Benign.  Neuro:  Nonfocal.     Lab Results: Lab Results  Component Value Date   WBC 5.8 10/20/2011   HGB 14.3 10/20/2011   HCT 41.2 10/20/2011   MCV 91.2 10/20/2011   PLT 210 10/20/2011     Chemistry      Component Value Date/Time   NA 141 10/20/2011 1622   K 4.0 10/20/2011 1622   CL 105 10/20/2011 1622   CO2 30 10/20/2011 1622   BUN 9 10/20/2011 1622   CREATININE 0.73 10/20/2011 1622      Component Value Date/Time   CALCIUM 9.6 10/20/2011 1622   ALKPHOS 59 10/20/2011 1622   AST 14 10/20/2011 1622   ALT 13 10/20/2011 1622   BILITOT 0.2* 10/20/2011 1622       Radiological Studies:  No results found.   IMPRESSIONS AND PLAN: A 46 y.o. female with   History of DCIS status post lumpectomy radiation on tamoxifen. She is doing well. I will see her in 6 months time. She will forward a copy of her mammogram to [place].  Spent more than half the time coordinating care, as well as discussion of BMI and its implications.      Lenisha Lacap 7/25/201310:33 AM Cell [phone]

## 2012-05-27 NOTE — Telephone Encounter (Signed)
Gave patient appointment for 11-26-2012 starting at 10:30am

## 2012-07-28 ENCOUNTER — Ambulatory Visit (INDEPENDENT_AMBULATORY_CARE_PROVIDER_SITE_OTHER): Payer: No Typology Code available for payment source | Admitting: Sports Medicine

## 2012-07-28 VITALS — BP 120/80 | Ht 66.0 in | Wt 168.0 lb

## 2012-07-28 DIAGNOSIS — M76899 Other specified enthesopathies of unspecified lower limb, excluding foot: Secondary | ICD-10-CM

## 2012-07-28 DIAGNOSIS — M25559 Pain in unspecified hip: Secondary | ICD-10-CM

## 2012-07-28 DIAGNOSIS — M7070 Other bursitis of hip, unspecified hip: Secondary | ICD-10-CM

## 2012-07-28 MED ORDER — MELOXICAM 15 MG PO TABS: 15.0000 mg | ORAL_TABLET | Freq: Every day | ORAL | Status: DC

## 2012-07-28 NOTE — Progress Notes (Signed)
Referred pt to [facility] in [place]- [address] location.  They will contact pt to set up PT.  [phone].

## 2012-07-28 NOTE — Progress Notes (Signed)
  Subjective:    Patient ID: Dawn Barton, female    DOB: 1966/10/22, 46 y.o.   MRN: [id number]  HPI chief complaint: Left hip pain  Patient is a 46 year old female that comes in today complaining of 3 weeks of left hip pain. Pain began while she was running a half marathon. She had increased her running in the weeks leading up to the half marathon. 10 miles into the race she felt pain in the anterior portion of her hip. Since that time she's had reoccurring pain anytime she tries to run. She initially had some popping and catching in the hip which would improve with ambulation, but she is now to the point to where even a fast-paced walk will hurt. She had some leftover prednisone from a previous low back injury and took this a few days ago but despite that her symptoms persist. No low back pain no associated numbness or tingling. No pain at rest but she has stiffness in the hip with first getting up. No problems with this hip in the past. She's been able to bike with no problems. She has also been using an over-the-counter topical pain reliever which has menthol in it and she does find this to be somewhat helpful.  She has a medical history significant for ductal carcinoma in situ. She's currently on tamoxifen. Otherwise she is healthy No known drug allergies She works as a Engineer, civil (consulting) for [employer]    Review of Systems     Objective:   Physical Exam Well-developed, well-nourished. No acute distress. Awake alert and oriented x3  Left hip shows smooth painless hip range of motion with a negative log roll. No tenderness over the greater trochanteric bursa. She does have some tenderness to palpation deep to the superficial hip flexors in the area of the iliopsoas and she has reproducible pain with resisted knee flexion. Negative straight leg raise. She walks with a slight limp. She is neurovascular intact distally.       Assessment & Plan:  1. Left hip pain likely secondary to iliopsoas  bursitis/tendinitis  Mobic 15 mg daily for 7 days then when necessary. No running until followup with me in 3 weeks. She will start physical therapy in [place]. She has a mud run scheduled for early November and we will see how she responds to this initial treatment before deciding whether or not she will be able to be compete. I discussed with her the possibility of imaging in the form of x-rays and possibly an MRI scan if her symptoms persist. This would be mainly to rule out a femoral neck stress fracture.

## 2012-08-02 ENCOUNTER — Encounter: Payer: Self-pay | Admitting: Radiation Oncology

## 2012-08-02 ENCOUNTER — Ambulatory Visit
Admission: RE | Admit: 2012-08-02 | Discharge: 2012-08-02 | Disposition: A | Discharge status: Home / Self Care | Payer: No Typology Code available for payment source | Source: Ambulatory Visit | Attending: Radiation Oncology | Admitting: Radiation Oncology

## 2012-08-02 ENCOUNTER — Ambulatory Visit: Payer: No Typology Code available for payment source | Admitting: Radiation Oncology

## 2012-08-02 VITALS — BP 111/60 | HR 68 | Temp 98.9°F | Wt 170.9 lb

## 2012-08-02 DIAGNOSIS — D0510 Intraductal carcinoma in situ of unspecified breast: Secondary | ICD-10-CM

## 2012-08-02 NOTE — Progress Notes (Signed)
Patient here for routine follow up post completion of right breast cancer.Doing very well.Has some mild hip discomfort from running half  marathon. Patient to schedule mammogram for October 2013 in [place] and have results forwarded to our office.Follow up with Dr.Rubin January 2014.No issues with breast cancer.Has mild tanning of skin.

## 2012-08-02 NOTE — Progress Notes (Signed)
  Radiation Oncology         [phone] ________________________________  Name: Dawn Barton MRN: [id number]  Date: 08/02/2012  DOB: 1966-02-17  Follow-Up Visit Note  CC: Provider Not In System  Bedelia Person, MD  Diagnosis:   Intraductal carcinoma the right breast  Interval Since Last Radiation:  7 months  Narrative:  The patient returns today for routine follow-up.  She is doing well at this time. She denies any pain in the right breast area, nipple discharge or bleeding.  She is on tamoxifen and seems to be tolerating this well. Patient is on the Herceptin study. She is scheduled for mammograms in October. This is being scheduled in [place] where she lives.  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                             ALLERGIES:   has no known allergies.  Meds: Current Outpatient Prescriptions  Medication Sig Dispense Refill  . meloxicam (MOBIC) 15 MG tablet Take 1 tablet (15 mg total) by mouth daily. Take daily for 7 days, then take as needed  40 tablet  1  . Multiple Vitamin (MULTIVITAMIN) tablet Take 1 tablet by mouth daily. multivitaqmin with b, doesn't take on regular basis       . tamoxifen (NOLVADEX) 20 MG tablet Take 20 mg by mouth daily.        Physical Findings: The patient is in no acute distress. Patient is alert and oriented.  weight is 170 lb 14.4 oz (77.52 kg). Her temperature is 98.9 F (37.2 C). Her blood pressure is 111/60 and her pulse is 68. Marland Kitchen  No palpable cervical subclavicular or axillary adenopathy. The lungs are clear to auscultation. The heart has regular rhythm and rate. Examination of the left breast reveals no mass or nipple discharge. Examination of the right breast reveals excellent cosmetic result this time. There is some very mild hyperpigmentation changes. There is no dominant mass appreciated in the  breast nipple discharge or bleeding.  Lab Findings: Lab Results  Component Value Date   WBC 3.6* 05/27/2012   HGB 13.9  05/27/2012   HCT 40.3 05/27/2012   MCV 92.4 05/27/2012   PLT 168 05/27/2012    @LASTCHEM @  Radiographic Findings: No results found.  Impression:  The patient is recovering from the effects of radiation.  No evidence of recurrence on exam today.  Plan:  When necessary followup.  She will continue long-term followup with Dr. Beatris Ship.  _____________________________________    Billie Lade, PhD, MD

## 2012-08-03 HISTORY — PX: LUMBAR DISC SURGERY: SHX700

## 2012-08-11 ENCOUNTER — Ambulatory Visit: Payer: No Typology Code available for payment source | Admitting: Sports Medicine

## 2012-10-25 ENCOUNTER — Telehealth: Payer: Self-pay | Admitting: *Deleted

## 2012-10-25 NOTE — Telephone Encounter (Signed)
Left message for pt to return my call so I can speak with her about her upcoming appt.

## 2012-10-26 ENCOUNTER — Telehealth: Payer: Self-pay | Admitting: *Deleted

## 2012-10-26 NOTE — Telephone Encounter (Signed)
Pt returned my call and I informed her that Dr. Donnie Coffin would not longer be here as of 11/03/12 and answered all questions at this time.  I confirmed 12/08/12 appt w/ pt.

## 2012-11-06 ENCOUNTER — Encounter: Payer: Self-pay | Admitting: Oncology

## 2012-11-07 ENCOUNTER — Other Ambulatory Visit: Payer: Self-pay | Admitting: Oncology

## 2012-11-26 ENCOUNTER — Other Ambulatory Visit: Payer: No Typology Code available for payment source | Admitting: Lab

## 2012-11-26 ENCOUNTER — Ambulatory Visit: Payer: No Typology Code available for payment source | Admitting: Oncology

## 2012-12-08 ENCOUNTER — Other Ambulatory Visit: Payer: No Typology Code available for payment source | Admitting: Lab

## 2012-12-08 ENCOUNTER — Ambulatory Visit: Payer: No Typology Code available for payment source | Admitting: Oncology

## 2012-12-08 ENCOUNTER — Ambulatory Visit (HOSPITAL_BASED_OUTPATIENT_CLINIC_OR_DEPARTMENT_OTHER): Payer: No Typology Code available for payment source | Admitting: Oncology

## 2012-12-08 ENCOUNTER — Telehealth: Payer: Self-pay | Admitting: Oncology

## 2012-12-08 ENCOUNTER — Other Ambulatory Visit (HOSPITAL_BASED_OUTPATIENT_CLINIC_OR_DEPARTMENT_OTHER): Payer: No Typology Code available for payment source | Admitting: Lab

## 2012-12-08 VITALS — BP 110/71 | HR 62 | Temp 98.4°F | Resp 20 | Ht 66.0 in | Wt 176.5 lb

## 2012-12-08 DIAGNOSIS — Z170 Estrogen receptor positive status [ER+]: Secondary | ICD-10-CM

## 2012-12-08 DIAGNOSIS — D0510 Intraductal carcinoma in situ of unspecified breast: Secondary | ICD-10-CM

## 2012-12-08 DIAGNOSIS — D0590 Unspecified type of carcinoma in situ of unspecified breast: Secondary | ICD-10-CM

## 2012-12-08 LAB — CBC WITH DIFFERENTIAL/PLATELET
Basophils Absolute: 0 10*3/uL (ref 0.0–0.1)
Eosinophils Absolute: 0.1 10*3/uL (ref 0.0–0.5)
HCT: 37.6 % (ref 34.8–46.6)
HGB: 12.8 g/dL (ref 11.6–15.9)
LYMPH%: 29 % (ref 14.0–49.7)
MCV: 90.9 fL (ref 79.5–101.0)
MONO%: 9.8 % (ref 0.0–14.0)
NEUT#: 2.7 10*3/uL (ref 1.5–6.5)
NEUT%: 57.6 % (ref 38.4–76.8)
Platelets: 155 10*3/uL (ref 145–400)
RBC: 4.14 10*6/uL (ref 3.70–5.45)

## 2012-12-08 LAB — COMPREHENSIVE METABOLIC PANEL (CC13)
Alkaline Phosphatase: 44 U/L (ref 40–150)
BUN: 9.5 mg/dL (ref 7.0–26.0)
CO2: 28 mEq/L (ref 22–29)
Creatinine: 0.8 mg/dL (ref 0.6–1.1)
Glucose: 89 mg/dl (ref 70–99)
Total Bilirubin: 0.4 mg/dL (ref 0.20–1.20)

## 2012-12-08 MED ORDER — TAMOXIFEN CITRATE 20 MG PO TABS: 20.0000 mg | ORAL_TABLET | Freq: Every day | ORAL | Status: DC

## 2012-12-08 NOTE — Telephone Encounter (Signed)
gv pt appt schedule for august. pof did not cross over. 2/5 pof retrieved from chart.

## 2012-12-08 NOTE — Progress Notes (Signed)
ID: Orlene Erm   DOB: April 21, 1966  MR#: [id number]  WUJ#:[id number]  PCP: Provider Not In System GYN: Clayborne Dana SU: Thomas Cornett OTHER MD: Antony Blackbird, Maeola Harman   HISTORY OF PRESENT ILLNESS: From Dr. Theron Arista Rubin's 10/20/2011 note: "presents with an abnormal mammogram. She has undergone annual mammography. This was initially performed in [place]. A cluster of right breast calcifications were seen in the lower outer quadrant of the right breast. 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 The patient underwent lumpectomy on 10/02/2011. This showed low-grade DCIS, grade 1, with clear surgical margins. There were a few foci of in situ carcinoma ranging from 0.1 2.3 cm in and around the previous biopsy cavity. The patient had an unremarkable postoperative course" The patient's subsequent history is as detailed below  INTERVAL HISTORY: Dawn Barton returns today for followup of her noninvasive breast cancer. She is establishing herself in my practice today. Unfortunately 2 weeks ago she lost her father to small cell lung cancer. He was a heavy smoker.  REVIEW OF SYSTEMS: She is tolerating tamoxifen with minimal hot flashes and mild vaginal dryness of her only symptoms. She is concerned about weight gain and increasing cholesterol problems, which are going to be postmenopausal rather than drug related. She tells me she did well with the second trastuzumab treatment, but with the first one she had significant chills and fever for about 24 hours. She recently had back surgery which she tolerated well, although she tells me she still not ambulating quite normally because of continuing discomfort in her left hip.. She thinks she needs glasses. Otherwise a detailed review of systems today was noncontributory and in particular she does not report any adverse events from her trastuzumab treatments except as  noted above.  PAST MEDICAL HISTORY: Past Medical History  Diagnosis Date  . Cancer     breast - right  . Complication of anesthesia     slow to wake up  . 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 Radiation 11/12/2011-12/26/2011    5040 cGy    PAST SURGICAL HISTORY: Past Surgical History  Procedure Date  . Tubal ligation   . Breast surgery 2003    breast lift  . Breast lumpectomy 10/02/2011    Procedure: LUMPECTOMY;  Surgeon: Maisie Fus A. Cornett, MD;  Location: [facility];  Service: General;  Laterality: Right;  right breast lumpectomy   status post microdiscectomy October 2013  FAMILY HISTORY No family history on file. The patient's father died from small cell lung cancer at the age of 48. The patient's mother is living, in her mid 48s. The patient has one brother, no sisters. There is no history of breast or ovarian cancer in the family.  GYNECOLOGIC HISTORY: Menarche age 47, first live birth age 47. She is GX P2. Last menstrual period March 2012. She never took hormone replacement but she did take birth control pills for approximately 15 years with no complications.  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 Her husband Loraine Leriche works in Airline pilot for and [employer]. Daughter Jodelle Red is a cardiac tach and phlebotomist in [place]. Daughter Lourdes Sledge is studying audiology. The patient has 1 grandchild. She attends a [employer].  ADVANCED DIRECTIVES: Not in place  HEALTH MAINTENANCE: History  Substance Use Topics  . Smoking status: Former Games developer  .  Smokeless tobacco: Never Used  . Alcohol Use: Yes     Comment: social drinker     Colonoscopy:  PAP:  Bone density:  Lipid panel:  No Known Allergies  Current Outpatient Prescriptions  Medication Sig Dispense Refill  . meloxicam (MOBIC) 15 MG tablet Take 1 tablet (15 mg total) by mouth daily. Take daily for 7 days, then take as needed   40 tablet  1  . Multiple Vitamin (MULTIVITAMIN) tablet Take 1 tablet by mouth daily. multivitaqmin with b, doesn't take on regular basis       . tamoxifen (NOLVADEX) 20 MG tablet Take 20 mg by mouth daily.        OBJECTIVE: Middle-aged white woman who appears well Filed Vitals:   12/08/12 0943  BP: 110/71  Pulse: 62  Temp: 98.4 F (36.9 C)  Resp: 20     Body mass index is 28.49 kg/(m^2).    ECOG FS: 0  Sclerae unicteric Oropharynx clear No cervical or supraclavicular adenopathy Lungs no rales or rhonchi Heart regular rate and rhythm Abd benign MSK no focal spinal tenderness, no peripheral edema Neuro: nonfocal, well oriented, neutral affect Breasts: The right breast is status prior lumpectomy and radiation. The cosmetic result is excellent. There is no evidence of local recurrence. The right axilla is benign. Left breast is unremarkable.   LAB RESULTS: Lab Results  Component Value Date   WBC 4.7 12/08/2012   NEUTROABS 2.7 12/08/2012   HGB 12.8 12/08/2012   HCT 37.6 12/08/2012   MCV 90.9 12/08/2012   PLT 155 12/08/2012      Chemistry      Component Value Date/Time   NA 140 05/27/2012 0932   K 3.9 05/27/2012 0932   CL 106 05/27/2012 0932   CO2 30 05/27/2012 0932   BUN 9 05/27/2012 0932   CREATININE 0.72 05/27/2012 0932      Component Value Date/Time   CALCIUM 9.5 05/27/2012 0932   ALKPHOS 44 05/27/2012 0932   AST 14 05/27/2012 0932   ALT 14 05/27/2012 0932   BILITOT 0.4 05/27/2012 0932       No results found for this basename: LABCA2    No components found with this basename: LABCA125    No results found for this basename: INR:1;PROTIME:1 in the last 168 hours  Urinalysis No results found for this basename: colorurine, appearanceur, labspec, phurine, glucoseu, hgbur, bilirubinur, ketonesur, proteinur, urobilinogen, nitrite, leukocytesur    STUDIES: No results found.  ASSESSMENT: 47 y.o. BRCA negative [place], [place] woman status post right lumpectomy 10/02/2011 for a  low-grade ductal carcinoma in situ measuring less than 5 mm, estrogen receptor positive and 100%, progesterone receptor positive at 4%.  (1) participated in NSABP B-43 study and received trastuzumab x2, last dose February 2013  (2) completed adjuvant radiation 12/26/2011  (3) started tamoxifen March 2013  PLAN: Dawn Barton is doing well from a breast cancer point of view. We spent the better part of her hour-long visit today reviewing her history, clearing up some questions of, and discussing her adjuvant therapy. She understands that in noninvasive breast cancer is not life-threatening. She decided, correctly in my opinion, to keep her breast, meaning she accepted some risk of local recurrence. She has significantly decrease that risk first by taking radiation, then likely by taking trastuzumab, and finally by continuing on tamoxifen. Her risk of local recurrence at this point is likely less than 5%  Generally she would have a 1% per year chance of developing a new breast cancer,  but tamoxifen cuts that risk in half as well, so her risk of a new breast cancer developing is approximately half percent or slightly less per year.  She is tolerating tamoxifen well. We discussed the possibility of using vaginal estrogen creams but the vaginal dryness issues not that big a problem for her.  She will return to see [place] in 6 months as per protocol. She knows to call for any problems that may develop before that visit.  Doloros Kwolek C    12/08/2012

## 2013-01-11 ENCOUNTER — Encounter (INDEPENDENT_AMBULATORY_CARE_PROVIDER_SITE_OTHER): Payer: Self-pay | Admitting: Surgery

## 2013-03-07 ENCOUNTER — Ambulatory Visit (INDEPENDENT_AMBULATORY_CARE_PROVIDER_SITE_OTHER): Payer: No Typology Code available for payment source | Admitting: Surgery

## 2013-06-06 ENCOUNTER — Telehealth: Payer: Self-pay | Admitting: *Deleted

## 2013-06-06 NOTE — Telephone Encounter (Signed)
Pt called to cancel her appts for 06/09/13 and rs. gv appt d/t for 06/13/13 w/labs @ 9:30am and ov @10am . Pt is aware...td

## 2013-06-08 ENCOUNTER — Ambulatory Visit: Payer: No Typology Code available for payment source | Admitting: Family

## 2013-06-08 ENCOUNTER — Other Ambulatory Visit: Payer: No Typology Code available for payment source | Admitting: Lab

## 2013-06-13 ENCOUNTER — Other Ambulatory Visit (HOSPITAL_BASED_OUTPATIENT_CLINIC_OR_DEPARTMENT_OTHER): Payer: 59 | Admitting: Lab

## 2013-06-13 ENCOUNTER — Ambulatory Visit (HOSPITAL_BASED_OUTPATIENT_CLINIC_OR_DEPARTMENT_OTHER): Payer: 59 | Admitting: Family

## 2013-06-13 ENCOUNTER — Encounter: Payer: Self-pay | Admitting: Family

## 2013-06-13 ENCOUNTER — Telehealth: Payer: Self-pay | Admitting: *Deleted

## 2013-06-13 VITALS — BP 124/82 | HR 75 | Temp 98.4°F | Resp 20 | Ht 66.0 in | Wt 178.1 lb

## 2013-06-13 DIAGNOSIS — D0590 Unspecified type of carcinoma in situ of unspecified breast: Secondary | ICD-10-CM

## 2013-06-13 DIAGNOSIS — D0511 Intraductal carcinoma in situ of right breast: Secondary | ICD-10-CM

## 2013-06-13 DIAGNOSIS — D0510 Intraductal carcinoma in situ of unspecified breast: Secondary | ICD-10-CM

## 2013-06-13 LAB — CBC WITH DIFFERENTIAL/PLATELET
Basophils Absolute: 0 10*3/uL (ref 0.0–0.1)
Eosinophils Absolute: 0.1 10*3/uL (ref 0.0–0.5)
HGB: 12.6 g/dL (ref 11.6–15.9)
LYMPH%: 34.2 % (ref 14.0–49.7)
MCV: 91 fL (ref 79.5–101.0)
MONO#: 0.4 10*3/uL (ref 0.1–0.9)
MONO%: 10.3 % (ref 0.0–14.0)
NEUT#: 2 10*3/uL (ref 1.5–6.5)
Platelets: 174 10*3/uL (ref 145–400)
RDW: 14.2 % (ref 11.2–14.5)
WBC: 3.8 10*3/uL — ABNORMAL LOW (ref 3.9–10.3)

## 2013-06-13 LAB — COMPREHENSIVE METABOLIC PANEL (CC13)
Albumin: 3.7 g/dL (ref 3.5–5.0)
Alkaline Phosphatase: 41 U/L (ref 40–150)
BUN: 9.6 mg/dL (ref 7.0–26.0)
CO2: 26 mEq/L (ref 22–29)
Glucose: 71 mg/dl (ref 70–140)
Potassium: 3.6 mEq/L (ref 3.5–5.1)
Sodium: 143 mEq/L (ref 136–145)
Total Protein: 6.7 g/dL (ref 6.4–8.3)

## 2013-06-13 MED ORDER — TAMOXIFEN CITRATE 20 MG PO TABS: 20.0000 mg | ORAL_TABLET | Freq: Every day | ORAL | Status: DC

## 2013-06-13 NOTE — Patient Instructions (Signed)
Please contact us at [phone] if you have any questions or concerns.  Please continue to do well and enjoy life!!!  Get plenty of rest, drink plenty of water, exercise daily (walking), eat a balanced diet.  Continue to take vitamin D3 1000 IUs daily.   Results for orders placed in visit on 06/13/13 (from the past 24 hour(s))  CBC WITH DIFFERENTIAL     Status: Abnormal   Collection Time    06/13/13  9:32 AM      Result Value Range   WBC 3.8 (*) 3.9 - 10.3 10e3/uL   NEUT# 2.0  1.5 - 6.5 10e3/uL   HGB 12.6  11.6 - 15.9 g/dL   HCT 81.1  91.4 - 78.2 %   Platelets 174  145 - 400 10e3/uL   MCV 91.0  79.5 - 101.0 fL   MCH 31.3  25.1 - 34.0 pg   MCHC 34.4  31.5 - 36.0 g/dL   RBC 9.56  2.13 - 0.86 10e6/uL   RDW 14.2  11.2 - 14.5 %   lymph# 1.3  0.9 - 3.3 10e3/uL   MONO# 0.4  0.1 - 0.9 10e3/uL   Eosinophils Absolute 0.1  0.0 - 0.5 10e3/uL   Basophils Absolute 0.0  0.0 - 0.1 10e3/uL   NEUT% 51.6  38.4 - 76.8 %   LYMPH% 34.2  14.0 - 49.7 %   MONO% 10.3  0.0 - 14.0 %   EOS% 2.8  0.0 - 7.0 %   BASO% 1.1  0.0 - 2.0 %   Narrative:    Performed At:  [facility]               [address]               [place], [place] [address]  COMPREHENSIVE METABOLIC PANEL (CC13)     Status: None   Collection Time    06/13/13  9:32 AM      Result Value Range   Sodium 143  136 - 145 mEq/L   Potassium 3.6  3.5 - 5.1 mEq/L   Chloride 107  98 - 109 mEq/L   CO2 26  22 - 29 mEq/L   Glucose 71  70 - 140 mg/dl   BUN 9.6  7.0 - 96.2 mg/dL   Creatinine 0.8  0.6 - 1.1 mg/dL   Total Bilirubin 9.52  0.20 - 1.20 mg/dL   Alkaline Phosphatase 41  40 - 150 U/L   AST 17  5 - 34 U/L   ALT 14  0 - 55 U/L   Total Protein 6.7  6.4 - 8.3 g/dL   Albumin 3.7  3.5 - 5.0 g/dL   Calcium 9.0  8.4 - 84.1 mg/dL   Narrative:    Note: New Reference ranges.Performed At:  [facility]               [address]               [place], [place] [address]

## 2013-06-13 NOTE — Telephone Encounter (Signed)
appts made and printed...td 

## 2013-06-13 NOTE — Progress Notes (Signed)
[facility]  Telephone:([phone] [phone]  OFFICE PROGRESS NOTE    ID: GAYLEEN SHOLTZ   DOB: [birth year]  MR#: [id number]  JYN#:[id number]   PCP: Provider Not In System GYN: Clayborne Dana, M.D. SU: Harriette Bouillon, M.D. RAD ONC: Antony Blackbird, M.D. SU: Maeola Harman, M.D.   HISTORY OF PRESENT ILLNESS: From Dr. Theron Arista Rubin's 10/20/2011 note: "Presents with an abnormal mammogram. She has undergone annual mammography. This was initially performed in [place]. A cluster of right breast calcifications were seen in the lower outer quadrant of the right breast. 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 The patient underwent lumpectomy on 10/02/2011. This showed low-grade DCIS, grade 1, with clear surgical margins. There were a few foci of in situ carcinoma ranging from 0.1 2.3 cm in and around the previous biopsy cavity. The patient had an unremarkable postoperative course."  The patient's subsequent history is as detailed below.  INTERVAL HISTORY: Mrs. Maclellan returns today for followup of DCIS of right breast.  Since her last office visit on 12/08/2012 she's been doing relatively well.  Her interval history is significant for training for an upcoming triathlon.     REVIEW OF SYSTEMS: She continues to tolerate Tamoxifen with minimal hot flashes.  She is concerned about weight gain which is a function of being postmenopausal rather than drug related.  She has minimal right hip discomfort but relates it to her triathlon training.  She thinks she needs glasses and states she will schedule an eye exam soon. Otherwise a detailed review of systems today was noncontributory.   PAST MEDICAL HISTORY: Past Medical History  Diagnosis Date  . Complication of anesthesia     slow to wake up  . 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  Radiation 11/12/2011-12/26/2011    5040 cGy    PAST SURGICAL HISTORY: Past Surgical History  Procedure Laterality Date  . Tubal ligation    . Breast surgery  2003    breast lift  . Breast lumpectomy  10/02/2011    Procedure: LUMPECTOMY;  Surgeon: Maisie Fus A. Cornett, MD;  Location: [facility];  Service: General;  Laterality: Right;  right breast lumpectomy  . Lumbar disc surgery  08/2012    L4-5 microdiscectomy      FAMILY HISTORY Family History  Problem Relation Age of Onset  . COPD Mother   . Atrial fibrillation Mother   . Cancer Father     NSCLC  . Sleep apnea Brother   The patient's father died from small cell lung cancer at the age of 56. The patient's mother is living, in her mid 27s. The patient has one brother, no sisters. There is no history of breast or ovarian cancer in the family.   GYNECOLOGIC HISTORY: Menarche age 64, first live birth age 62. She is GX P2. Last menstrual cycle was in 01/2011. She never took hormone replacement but she did take birth control pills for approximately 15 years with no complications.   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  Her husband Loraine Leriche works in Airline pilot for and [employer]. Daughter Jodelle Red is a Biochemist, clinical in [employer]. Daughter Lourdes Sledge is studying audiology. The patient has 1 grandchild. She attends a [employer].  ADVANCED DIRECTIVES: Not in place  HEALTH MAINTENANCE: History  Substance Use Topics  . Smoking status: Former Games developer  . Smokeless tobacco: Never Used  . Alcohol Use: Yes     Comment: social drinker     Colonoscopy: N/A  PAP: N/A  Bone density: N/A  Lipid panel: N/A  No Known Allergies  Current Outpatient Prescriptions  Medication Sig Dispense Refill  . Cholecalciferol (VITAMIN D3) 1000 UNITS CAPS Take 1 capsule by mouth daily.      . COLLAGEN PO Take 3 tablets by mouth daily.      . Evening  Primrose Oil 1000 MG CAPS Take 1 capsule by mouth daily.      . naproxen sodium (ANAPROX) 220 MG tablet Take 220 mg by mouth 2 (two) times daily with a meal.      . tamoxifen (NOLVADEX) 20 MG tablet Take 1 tablet (20 mg total) by mouth daily.  30 tablet  12   No current facility-administered medications for this visit.    OBJECTIVE: Middle-aged white woman who appears well Filed Vitals:   06/13/13 0950  BP: 124/82  Pulse: 75  Temp: 98.4 F (36.9 C)  Resp: 20     Body mass index is 28.76 kg/(m^2).    ECOG FS: 0  General appearance: Alert, cooperative, well nourished, no apparent distress Head: Normocephalic, without obvious abnormality, atraumatic Eyes: Conjunctivae/corneas clear, PERRLA, EOMI Nose: Nares, septum and mucosa are normal, no drainage or sinus tenderness Neck: No adenopathy, supple, symmetrical, trachea midline, thyroid not enlarged, no tenderness Resp: Clear to auscultation bilaterally Cardio: Regular rate and rhythm, S1, S2 normal, no murmur, click, rub or gallop Breasts: Deferred GI: Soft, distended, non-tender, hypoactive bowel sounds, no organomegaly Extremities: Extremities normal, atraumatic, no cyanosis or edema, bilateral lower extremity varicose/spider veins Lymph nodes: Cervical, supraclavicular, and axillary nodes normal Neurologic: Grossly normal  LAB RESULTS: Lab Results  Component Value Date   WBC 3.8* 06/13/2013   NEUTROABS 2.0 06/13/2013   HGB 12.6 06/13/2013   HCT 36.7 06/13/2013   MCV 91.0 06/13/2013   PLT 174 06/13/2013      Chemistry      Component Value Date/Time   NA 143 06/13/2013 0932   NA 140 05/27/2012 0932   K 3.6 06/13/2013 0932   K 3.9 05/27/2012 0932   CL 103 12/08/2012 0928   CL 106 05/27/2012 0932   CO2 26 06/13/2013 0932   CO2 30 05/27/2012 0932   BUN 9.6 06/13/2013 0932   BUN 9 05/27/2012 0932   CREATININE 0.8 06/13/2013 0932   CREATININE 0.72 05/27/2012 0932      Component Value Date/Time   CALCIUM 9.0 06/13/2013 0932   CALCIUM  9.5 05/27/2012 0932   ALKPHOS 41 06/13/2013 0932   ALKPHOS 44 05/27/2012 0932   AST 17 06/13/2013 0932   AST 14 05/27/2012 0932   ALT 14 06/13/2013 0932   ALT 14 05/27/2012 0932   BILITOT 0.47 06/13/2013 0932   BILITOT 0.4 05/27/2012 0932      No results found for this basename: LABCA2    No components found with this basename: LABCA125    No results found for this basename: INR,  in the last 168 hours  Urinalysis No results found for this basename: colorurine,  appearanceur,  labspec,  phurine,  glucoseu,  hgbur,  bilirubinur,  ketonesur,  proteinur,  urobilinogen,  nitrite,  leukocytesur    STUDIES: No results found.  ASSESSMENT: 47 y.o. BRCA negative, [place], [place] woman status post right lumpectomy 10/02/2011 for a low-grade ductal  carcinoma in situ measuring less than 5 mm, estrogen receptor positive and 100%, progesterone receptor positive at 4%.  (1) Participated in NSABP B-43 study and received Trastuzumab x 2, last dose February 2013  (2) Completed adjuvant radiation 12/26/2011  (3) Started Tamoxifen in 01/2012  PLAN: Mrs. Lerner is doing well from a breast cancer point of view.  She understands that in noninvasive breast cancer is not life-threatening. Generally she would have a 1% per year chance of developing a new breast cancer, but tamoxifen cuts that risk in half as well, so her risk of a new breast cancer developing is approximately half percent or slightly less per year.  She is tolerating tamoxifen well.   We plan to see her again in 6 months (12/2013) at which time we will check laboratories of CBC, CMP, LDH, and vitamin D level.  The patient states she completes her annual mammograms with [facility].  She stated she will have the results of her 2013 and 2014 mammogram reports forwarded to our office.  All questions answered.  Mrs. Kosch was encouraged to contact us in the interim with any questions, concerns, or problems.  Larina Bras    06/13/2013 1:07 PM

## 2013-06-27 ENCOUNTER — Emergency Department (HOSPITAL_COMMUNITY)
Admission: EM | Admit: 2013-06-27 | Discharge: 2013-06-27 | Disposition: A | Discharge status: Home / Self Care | Payer: 59 | Source: Home / Self Care | Attending: Family Medicine | Admitting: Family Medicine

## 2013-06-27 ENCOUNTER — Encounter (HOSPITAL_COMMUNITY): Payer: Self-pay | Admitting: Emergency Medicine

## 2013-06-27 DIAGNOSIS — J069 Acute upper respiratory infection, unspecified: Secondary | ICD-10-CM

## 2013-06-27 LAB — POCT RAPID STREP A: Streptococcus, Group A Screen (Direct): NEGATIVE

## 2013-06-27 MED ORDER — IPRATROPIUM BROMIDE 0.06 % NA SOLN: 2.0000 | Freq: Four times a day (QID) | NASAL | Status: DC

## 2013-06-27 MED ORDER — AZITHROMYCIN 250 MG PO TABS
ORAL_TABLET | ORAL | Status: DC
Start: 2013-06-27 — End: 2013-12-13

## 2013-06-27 NOTE — ED Notes (Signed)
C/o sore throat and productive cough which started last night.  Patient states she has been Advil cold and sinus every four hours.  Does have drainage in her throat.

## 2013-06-27 NOTE — ED Provider Notes (Signed)
CSN: [id number]     Arrival date & time 06/27/13  1946 History   First MD Initiated Contact with Patient 06/27/13 2039     Chief Complaint  Patient presents with  . Sore Throat   (Consider location/radiation/quality/duration/timing/severity/associated sxs/prior Treatment) Patient is a 47 y.o. female presenting with pharyngitis. The history is provided by the patient.  Sore Throat This is a new problem. The current episode started yesterday. The problem has been gradually worsening. Pertinent negatives include no chest pain, no abdominal pain and no shortness of breath.    Past Medical History  Diagnosis Date  . Complication of anesthesia     slow to wake up  . 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 Radiation 11/12/2011-12/26/2011    5040 cGy   Past Surgical History  Procedure Laterality Date  . Tubal ligation    . Breast surgery  2003    breast lift  . Breast lumpectomy  10/02/2011    Procedure: LUMPECTOMY;  Surgeon: Maisie Fus A. Cornett, MD;  Location: [facility];  Service: General;  Laterality: Right;  right breast lumpectomy  . Lumbar disc surgery  08/2012    L4-5 microdiscectomy    Family History  Problem Relation Age of Onset  . COPD Mother   . Atrial fibrillation Mother   . Cancer Father     NSCLC  . Sleep apnea Brother    History  Substance Use Topics  . Smoking status: Former Games developer  . Smokeless tobacco: Never Used  . Alcohol Use: Yes     Comment: social drinker   OB History   Grav Para Term Preterm Abortions TAB SAB Ect Mult Living   2 2             Obstetric Comments   Menarche age 58, ist pregnancy age 15 . 2 vah=ginal births, 2 daughters     Review of Systems  Constitutional: Negative.   HENT: Positive for congestion, sore throat, rhinorrhea and postnasal drip. Negative for trouble swallowing.   Respiratory: Positive for cough. Negative for shortness of breath.   Cardiovascular: Negative for  chest pain.  Gastrointestinal: Negative for abdominal pain.    Allergies  Review of patient's allergies indicates no known allergies.  Home Medications   Current Outpatient Rx  Name  Route  Sig  Dispense  Refill  . azithromycin (ZITHROMAX Z-PAK) 250 MG tablet      Take as directed on pack   6 each   0   . Cholecalciferol (VITAMIN D3) 1000 UNITS CAPS   Oral   Take 1 capsule by mouth daily.         . COLLAGEN PO   Oral   Take 3 tablets by mouth daily.         . Evening Primrose Oil 1000 MG CAPS   Oral   Take 1 capsule by mouth daily.         Marland Kitchen ipratropium (ATROVENT) 0.06 % nasal spray   Nasal   Place 2 sprays into the nose 4 (four) times daily.   15 mL   1   . naproxen sodium (ANAPROX) 220 MG tablet   Oral   Take 220 mg by mouth 2 (two) times daily with a meal.         . tamoxifen (NOLVADEX) 20 MG tablet   Oral   Take 1 tablet (20 mg total) by mouth daily.   30 tablet   12  BP 116/72  Pulse 84  Temp(Src) 98 F (36.7 C) (Oral)  Resp 18  SpO2 96% Physical Exam  Nursing note and vitals reviewed. Constitutional: She is oriented to person, place, and time. She appears well-developed and well-nourished.  HENT:  Head: Normocephalic.  Right Ear: External ear normal.  Left Ear: External ear normal.  Mouth/Throat: Uvula is midline and mucous membranes are normal. Posterior oropharyngeal edema and posterior oropharyngeal erythema present. No oropharyngeal exudate.  Eyes: Pupils are equal, round, and reactive to light.  Neck: Normal range of motion. Neck supple.  Cardiovascular: Normal rate, regular rhythm, normal heart sounds and intact distal pulses.   Pulmonary/Chest: Effort normal and breath sounds normal.  Lymphadenopathy:    She has no cervical adenopathy.  Neurological: She is alert and oriented to person, place, and time.  Skin: Skin is warm and dry.    ED Course  Procedures (including critical care time) Labs Review Labs Reviewed  POCT  RAPID STREP A ([facility]RG CARE ONLY)   Imaging Review No results found.  MDM   1. URI (upper respiratory infection)       Linna Hoff, MD 06/27/13 2108

## 2013-06-29 LAB — CULTURE, GROUP A STREP

## 2013-08-31 ENCOUNTER — Other Ambulatory Visit (HOSPITAL_COMMUNITY): Payer: Self-pay | Admitting: Unknown Physician Specialty

## 2013-08-31 ENCOUNTER — Other Ambulatory Visit: Payer: Self-pay | Admitting: Unknown Physician Specialty

## 2013-08-31 DIAGNOSIS — Z853 Personal history of malignant neoplasm of breast: Secondary | ICD-10-CM

## 2013-08-31 DIAGNOSIS — Z9889 Other specified postprocedural states: Secondary | ICD-10-CM

## 2013-09-07 ENCOUNTER — Encounter (HOSPITAL_COMMUNITY): Payer: 59

## 2013-09-14 ENCOUNTER — Ambulatory Visit (HOSPITAL_COMMUNITY)
Admission: RE | Admit: 2013-09-14 | Discharge: 2013-09-14 | Disposition: A | Discharge status: Home / Self Care | Payer: 59 | Source: Ambulatory Visit | Attending: Unknown Physician Specialty | Admitting: Unknown Physician Specialty

## 2013-09-14 DIAGNOSIS — Z853 Personal history of malignant neoplasm of breast: Secondary | ICD-10-CM | POA: Insufficient documentation

## 2013-09-14 DIAGNOSIS — Z9889 Other specified postprocedural states: Secondary | ICD-10-CM

## 2013-09-21 ENCOUNTER — Encounter (HOSPITAL_COMMUNITY): Payer: 59

## 2013-12-12 ENCOUNTER — Other Ambulatory Visit: Payer: Self-pay | Admitting: *Deleted

## 2013-12-12 DIAGNOSIS — C50919 Malignant neoplasm of unspecified site of unspecified female breast: Secondary | ICD-10-CM

## 2013-12-13 ENCOUNTER — Telehealth: Payer: Self-pay | Admitting: *Deleted

## 2013-12-13 ENCOUNTER — Other Ambulatory Visit (HOSPITAL_BASED_OUTPATIENT_CLINIC_OR_DEPARTMENT_OTHER): Payer: 59

## 2013-12-13 ENCOUNTER — Ambulatory Visit (HOSPITAL_BASED_OUTPATIENT_CLINIC_OR_DEPARTMENT_OTHER): Payer: 59 | Admitting: Oncology

## 2013-12-13 VITALS — BP 112/64 | HR 57 | Temp 98.3°F | Resp 18 | Ht 66.0 in | Wt 177.0 lb

## 2013-12-13 DIAGNOSIS — C50919 Malignant neoplasm of unspecified site of unspecified female breast: Secondary | ICD-10-CM

## 2013-12-13 DIAGNOSIS — D0510 Intraductal carcinoma in situ of unspecified breast: Secondary | ICD-10-CM

## 2013-12-13 DIAGNOSIS — D0590 Unspecified type of carcinoma in situ of unspecified breast: Secondary | ICD-10-CM

## 2013-12-13 DIAGNOSIS — Z170 Estrogen receptor positive status [ER+]: Secondary | ICD-10-CM

## 2013-12-13 LAB — CBC WITH DIFFERENTIAL/PLATELET
BASO%: 0.6 % (ref 0.0–2.0)
BASOS ABS: 0 10*3/uL (ref 0.0–0.1)
EOS ABS: 0.1 10*3/uL (ref 0.0–0.5)
EOS%: 1.7 % (ref 0.0–7.0)
HCT: 38.2 % (ref 34.8–46.6)
HEMOGLOBIN: 12.6 g/dL (ref 11.6–15.9)
LYMPH#: 1.2 10*3/uL (ref 0.9–3.3)
LYMPH%: 33.8 % (ref 14.0–49.7)
MCH: 29.9 pg (ref 25.1–34.0)
MCHC: 33 g/dL (ref 31.5–36.0)
MCV: 90.7 fL (ref 79.5–101.0)
MONO#: 0.3 10*3/uL (ref 0.1–0.9)
MONO%: 9.6 % (ref 0.0–14.0)
NEUT%: 54.3 % (ref 38.4–76.8)
NEUTROS ABS: 1.9 10*3/uL (ref 1.5–6.5)
Platelets: 169 10*3/uL (ref 145–400)
RBC: 4.21 10*6/uL (ref 3.70–5.45)
RDW: 13.5 % (ref 11.2–14.5)
WBC: 3.6 10*3/uL — ABNORMAL LOW (ref 3.9–10.3)

## 2013-12-13 LAB — COMPREHENSIVE METABOLIC PANEL (CC13)
ALBUMIN: 4 g/dL (ref 3.5–5.0)
ALT: 19 U/L (ref 0–55)
AST: 19 U/L (ref 5–34)
Alkaline Phosphatase: 45 U/L (ref 40–150)
Anion Gap: 8 mEq/L (ref 3–11)
BUN: 8.7 mg/dL (ref 7.0–26.0)
CALCIUM: 9.3 mg/dL (ref 8.4–10.4)
CHLORIDE: 106 meq/L (ref 98–109)
CO2: 28 mEq/L (ref 22–29)
Creatinine: 0.8 mg/dL (ref 0.6–1.1)
GLUCOSE: 81 mg/dL (ref 70–140)
POTASSIUM: 4.2 meq/L (ref 3.5–5.1)
Sodium: 141 mEq/L (ref 136–145)
Total Bilirubin: 0.51 mg/dL (ref 0.20–1.20)
Total Protein: 6.7 g/dL (ref 6.4–8.3)

## 2013-12-13 LAB — LACTATE DEHYDROGENASE (CC13): LDH: 211 U/L (ref 125–245)

## 2013-12-13 NOTE — Telephone Encounter (Signed)
appts made and printed...td 

## 2013-12-13 NOTE — Progress Notes (Signed)
[place]  Telephone:(336) [phone] Fax:(336) [phone]  OFFICE PROGRESS NOTE    ID: Dawn Barton   DOB: 1966/04/15  MR#: [id number]  JYN#:[id number]   PCP: Provider Not In System GYN: Elmarie Mainland, M.D. SU: Erroll Luna, M.D. RAD ONC: Gery Pray, M.D. SU: Erline Levine, M.D.   HISTORY OF PRESENT ILLNESS: From Dr. Collier Salina Rubin's 10/20/2011 note: "Presents with an abnormal mammogram. She has undergone annual mammography. This was initially performed in [place]. A cluster of right breast calcifications were seen in the lower outer quadrant of the right breast. 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 The patient underwent lumpectomy on 10/02/2011. This showed low-grade DCIS, grade 1, with clear surgical margins. There were a few foci of in situ carcinoma ranging from 0.1 2.3 cm in and around the previous biopsy cavity. The patient had an unremarkable postoperative course."  The patient's subsequent history is as detailed below.  INTERVAL HISTORY: Dawn Barton returns today for followup of her ductal carcinoma in situ. The interval history is generally unremarkable. She is studying online to get a bachelor's of signs in nursing. She continues to run about 4 days a week and is training for a half marathon. Her youngest daughter is in college in [place] is studying to be an audiologist  REVIEW OF SYSTEMS: Dawn Barton is tolerating the tamoxifen with no side effects that she is aware of. She is due for an eye exam. Hot flashes and vaginal wetness or not an issue. A detailed review of systems today was entirely negative  PAST MEDICAL HISTORY: Past Medical History  Diagnosis Date  . Complication of anesthesia     slow to wake up  . 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 Radiation 11/12/2011-12/26/2011    5040 cGy    PAST SURGICAL  HISTORY: Past Surgical History  Procedure Laterality Date  . Tubal ligation    . Breast surgery  2003    breast lift  . Breast lumpectomy  10/02/2011    Procedure: LUMPECTOMY;  Surgeon: Marcello Moores A. Cornett, MD;  Location: [facility];  Service: General;  Laterality: Right;  right breast lumpectomy  . Lumbar disc surgery  08/2012    L4-5 microdiscectomy      FAMILY HISTORY Family History  Problem Relation Age of Onset  . COPD Mother   . Atrial fibrillation Mother   . Cancer Father     NSCLC  . Sleep apnea Brother   The patient's father died from small cell lung cancer at the age of 30. The patient's mother is living, in her mid 67s. The patient has one brother, no sisters. There is no history of breast or ovarian cancer in the family.   GYNECOLOGIC HISTORY: Menarche age 89, first live birth age 44. She is GX P2. Last menstrual cycle was in 01/2011. She never took hormone replacement but she did take birth control pills for approximately 15 years with no complications.   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  Her husband Dawn Barton works in Press photographer for and [employer]. Daughter Dawn Barton is a Air cabin crew in [place]. Daughter Dawn Barton is studying audiology. The patient has 1 grandchild. She attends a [employer].  ADVANCED DIRECTIVES: Not in place  HEALTH MAINTENANCE: History  Substance Use  Topics  . Smoking status: Former Research scientist (life sciences)  . Smokeless tobacco: Never Used  . Alcohol Use: Yes     Comment: social drinker     Colonoscopy: N/A  PAP: N/A  Bone density: N/A  Lipid panel: N/A  No Known Allergies  Current Outpatient Prescriptions  Medication Sig Dispense Refill  . azithromycin (ZITHROMAX Z-PAK) 250 MG tablet Take as directed on pack  6 each  0  . Cholecalciferol (VITAMIN D3) 1000 UNITS CAPS Take 1 capsule by mouth daily.      . COLLAGEN PO Take 3 tablets by mouth  daily.      . Evening Primrose Oil 1000 MG CAPS Take 1 capsule by mouth daily.      Marland Kitchen ipratropium (ATROVENT) 0.06 % nasal spray Place 2 sprays into the nose 4 (four) times daily.  15 mL  1  . naproxen sodium (ANAPROX) 220 MG tablet Take 220 mg by mouth 2 (two) times daily with a meal.      . tamoxifen (NOLVADEX) 20 MG tablet Take 1 tablet (20 mg total) by mouth daily.  30 tablet  12   No current facility-administered medications for this visit.    OBJECTIVE: Middle-aged white woman in no acute distress Filed Vitals:   12/13/13 1001  BP: 112/64  Pulse: 57  Temp: 98.3 F (36.8 C)  Resp: 18     Body mass index is 28.58 kg/(m^2).    ECOG FS: 0  Sclerae unicteric, pupils equal and round No cervical or supraclavicular adenopathy Lungs no rales or rhonchi Heart regular rate and rhythm Abd soft, nontender, positive bowel sounds MSK no focal spinal tenderness, no upper extremity lymphedema Neuro: nonfocal, well oriented, appropriate affect Breasts: The right breast is status post lumpectomy and radiation. There is no evidence of local recurrence. The right axilla is benign. The left breast is unremarkable.   LAB RESULTS: Lab Results  Component Value Date   WBC 3.6* 12/13/2013   NEUTROABS 1.9 12/13/2013   HGB 12.6 12/13/2013   HCT 38.2 12/13/2013   MCV 90.7 12/13/2013   PLT 169 12/13/2013      Chemistry      Component Value Date/Time   NA 143 06/13/2013 0932   NA 140 05/27/2012 0932   K 3.6 06/13/2013 0932   K 3.9 05/27/2012 0932   CL 103 12/08/2012 0928   CL 106 05/27/2012 0932   CO2 26 06/13/2013 0932   CO2 30 05/27/2012 0932   BUN 9.6 06/13/2013 0932   BUN 9 05/27/2012 0932   CREATININE 0.8 06/13/2013 0932   CREATININE 0.72 05/27/2012 0932      Component Value Date/Time   CALCIUM 9.0 06/13/2013 0932   CALCIUM 9.5 05/27/2012 0932   ALKPHOS 41 06/13/2013 0932   ALKPHOS 44 05/27/2012 0932   AST 17 06/13/2013 0932   AST 14 05/27/2012 0932   ALT 14 06/13/2013 0932   ALT 14 05/27/2012 0932    BILITOT 0.47 06/13/2013 0932   BILITOT 0.4 05/27/2012 0932      No results found for this basename: LABCA2    No components found with this basename: LABCA125    No results found for this basename: INR,  in the last 168 hours  Urinalysis No results found for this basename: colorurine,  appearanceur,  labspec,  phurine,  glucoseu,  hgbur,  bilirubinur,  ketonesur,  proteinur,  urobilinogen,  nitrite,  leukocytesur    STUDIES: DIGITAL DIAGNOSTIC BILATERAL MAMMOGRAM WITH CAD  COMPARISON: [facility]  ACR Breast Density  Category c: The breasts are heterogeneously  dense, which may obscure small masses.  FINDINGS:  Stable postlumpectomy changes right breast. No concerning masses,  calcifications or nonsurgical architectural distortion.  Mammographic images were processed with CAD.  IMPRESSION:  No mammographic evidence for malignancy.  RECOMMENDATION:  Bilateral diagnostic mammography in 1 year.  I have discussed the findings and recommendations with the patient.  Results were also provided in writing at the conclusion of the  visit. If applicable, a reminder letter will be sent to the patient  regarding the next appointment.  BI-RADS CATEGORY 1: Negative  Electronically Signed  By: Lovey Newcomer M.D.  On: 09/14/2013 14:13    ASSESSMENT: 48 y.o. BRCA negative, [place], [place] woman status post right lumpectomy 10/02/2011 for a low-grade ductal carcinoma in situ measuring less than 5 mm, estrogen receptor positive and 100%, progesterone receptor positive at 4%.  (1) Participated in NSABP B-43 study and received Trastuzumab x 2, last dose February 2013  (2) Completed adjuvant radiation 12/26/2011  (3) Started Tamoxifen in 01/2012  PLAN: Festus Aloe is doing terrific as far as her noninvasive breast cancer is concerned. She does need to establish yourself with a primary care physician, and probably that will be the lumbar group, since she is now under the [employer] health plan. She  will need a colonoscopy in about a year and a half. She is thinking already that Dr. Delfin Edis will be her choice for that.  Otherwise she needs to be followed every 6 months until she completes her 5 years of followup, which will be 4 years from now. If she does establish herself with her primary care physician, at least some of those followup appointments could be through his or her office.  We will not do lab work at the next visit, 6 months from now, but if she does not have a primary care physician already a year from now we will repeat her labs then.  She knows to call for any problems that may develop before her next visit here.  MAGRINAT,GUSTAV C   12/13/2013 10:04 AM

## 2013-12-13 NOTE — Addendum Note (Signed)
Addended by: Laureen Abrahams on: 12/13/2013 06:13 PM   Modules accepted: Orders

## 2013-12-14 LAB — VITAMIN D 25 HYDROXY (VIT D DEFICIENCY, FRACTURES): VIT D 25 HYDROXY: 35 ng/mL (ref 30–89)

## 2014-02-20 NOTE — Telephone Encounter (Signed)
Please see Visit Info comments 

## 2014-06-14 ENCOUNTER — Encounter: Payer: 59 | Admitting: Nurse Practitioner

## 2014-06-14 NOTE — Progress Notes (Signed)
This encounter was created in error - please disregard.

## 2014-06-15 ENCOUNTER — Telehealth: Payer: Self-pay | Admitting: *Deleted

## 2014-06-15 NOTE — Telephone Encounter (Signed)
Called to inquire why pt didn't come for appt yesterday. No answer, but left a message to let pt know we can reschedule her for a follow up appt and to call this nurse back @ [phone]. Message to be forwarded to [facility].

## 2014-07-13 ENCOUNTER — Telehealth: Payer: Self-pay | Admitting: Oncology

## 2014-07-13 ENCOUNTER — Other Ambulatory Visit: Payer: Self-pay | Admitting: *Deleted

## 2014-07-13 DIAGNOSIS — D0511 Intraductal carcinoma in situ of right breast: Secondary | ICD-10-CM

## 2014-07-13 MED ORDER — TAMOXIFEN CITRATE 20 MG PO TABS: 20.0000 mg | ORAL_TABLET | Freq: Every day | ORAL | Status: DC

## 2014-07-13 NOTE — Telephone Encounter (Signed)
returned pt call from VM-pt wanted to r/s appt-r/s and gave pt new time & date-pt understood

## 2014-07-13 NOTE — Telephone Encounter (Signed)
returned pt call line busy °

## 2014-07-26 ENCOUNTER — Ambulatory Visit (HOSPITAL_BASED_OUTPATIENT_CLINIC_OR_DEPARTMENT_OTHER): Payer: 59 | Admitting: Nurse Practitioner

## 2014-07-26 ENCOUNTER — Telehealth: Payer: Self-pay | Admitting: Nurse Practitioner

## 2014-07-26 ENCOUNTER — Encounter: Payer: Self-pay | Admitting: Nurse Practitioner

## 2014-07-26 VITALS — BP 106/53 | HR 60 | Temp 98.2°F | Resp 18 | Ht 66.0 in | Wt 176.8 lb

## 2014-07-26 DIAGNOSIS — N898 Other specified noninflammatory disorders of vagina: Secondary | ICD-10-CM | POA: Insufficient documentation

## 2014-07-26 DIAGNOSIS — R232 Flushing: Secondary | ICD-10-CM | POA: Insufficient documentation

## 2014-07-26 DIAGNOSIS — N9489 Other specified conditions associated with female genital organs and menstrual cycle: Secondary | ICD-10-CM

## 2014-07-26 DIAGNOSIS — D0511 Intraductal carcinoma in situ of right breast: Secondary | ICD-10-CM

## 2014-07-26 DIAGNOSIS — D0590 Unspecified type of carcinoma in situ of unspecified breast: Secondary | ICD-10-CM

## 2014-07-26 DIAGNOSIS — N951 Menopausal and female climacteric states: Secondary | ICD-10-CM

## 2014-07-26 NOTE — Telephone Encounter (Signed)
sch pt appt-gave pt copy of sch

## 2014-07-26 NOTE — Progress Notes (Signed)
[facility]  [phone]) [phone] Fax:(336) [phone]  OFFICE PROGRESS NOTE    ID: LOELLA HICKLE   DOB: 1965/12/20  MR#: [id number]  ZDG#:[id number]   PCP: PROVIDER NOT IN SYSTEM GYN: Elmarie Mainland, M.D. SU: Erroll Luna, M.D. RAD ONC: Gery Pray, M.D. SU: Erline Levine, M.D.  CHIEF COMPLAINT: right breast cancer CURRENT TREATMENT: tamoxifen 16m daily  BREAST CANCER HISTORY: From Dr. PCollier SalinaRubin's 10/20/2011 note: "Presents with an abnormal mammogram. She has undergone annual mammography. [place] cluster of right breast calcifications were seen in the lower outer quadrant of the right breast. 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 This showed low-grade DCIS, grade 1, with clear surgical margins. There were a few foci of in situ carcinoma ranging from 0.1 2.3 cm in and around the previous biopsy cavity. The patient had an unremarkable postoperative course."  The patient's subsequent history is as detailed below.  INTERVAL HISTORY:  PTrangreturns for follow up of her ductal carcinoma in situ. She has been on tamoxifen since March 2013. Her main complaints are occasional hot flashes and persistent vaginal dryness. The interval history is generally unremarkable. She continues to be active regularly throughout the week, but is concerned about her recent weight gain. She is working on her diet.   REVIEW OF SYSTEMS: PUndradenies fevers, chills, nausea, vomiting, or changes in bowel or bladder habits. She has no shortness of breath, chest pain, cough, palpitations, or fatigue. A detailed review of systems today was otherwise entirely negative.  PAST MEDICAL HISTORY: Past Medical History  Diagnosis Date  . Complication of anesthesia     slow to wake up  . 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 Radiation 11/12/2011-12/26/2011    5040 cGy    PAST SURGICAL HISTORY: Past Surgical History  Procedure Laterality Date  . Tubal ligation    . Breast surgery  2003    breast lift  . Breast lumpectomy  10/02/2011    Procedure: LUMPECTOMY;  Surgeon: TMarcello MooresA. Cornett, MD;  [facility]  Service: General;  Laterality: Right;  right breast lumpectomy  . Lumbar disc surgery  08/2012    L4-5 microdiscectomy      FAMILY HISTORY Family History  Problem Relation Age of Onset  . COPD Mother   . Atrial fibrillation Mother   . Cancer Father     NSCLC  . Sleep apnea Brother   The patient's father died from small cell lung cancer at the age of 758 The patient's mother is living, in her mid 763s The patient has one brother, no sisters. There is no history of breast or ovarian cancer in the family.   GYNECOLOGIC HISTORY: Menarche age 48 first live birth age 89 She is GX P2. Last menstrual cycle was in 01/2011. She never took hormone replacement but she did take birth control pills for approximately 15 years with no complications.   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  Her husband MElta Guadeloupeworks in sPress photographerfor and [place] Daughter CAlbina Billetis a cAir cabin crewin [place] Daughter TBeulah Gandyis studying audiology. The patient has 1 grandchild. She attends a l[employer]  ADVANCED DIRECTIVES: Not in place  HEALTH MAINTENANCE: History  Substance Use Topics  . Smoking status: Former Research scientist (life sciences)  . Smokeless tobacco: Never Used  . Alcohol Use: Yes     Comment: social drinker     Colonoscopy: N/A  PAP: N/A  Bone density: N/A  Lipid panel: N/A  No Known Allergies  Current Outpatient Prescriptions  Medication Sig Dispense Refill  . COLLAGEN PO Take 3 tablets by mouth daily.      . tamoxifen (NOLVADEX) 20 MG tablet Take 1  tablet (20 mg total) by mouth daily.  30 tablet  0  . ipratropium (ATROVENT) 0.06 % nasal spray Place 2 sprays into the nose 4 (four) times daily.  15 mL  1  . naproxen sodium (ANAPROX) 220 MG tablet Take 220 mg by mouth 2 (two) times daily with a meal.       No current facility-administered medications for this visit.    OBJECTIVE: Middle-aged white woman in no acute distress Filed Vitals:   07/26/14 1024  BP: 106/53  Pulse: 60  Temp: 98.2 F (36.8 C)  Resp: 18     Body mass index is 28.55 kg/(m^2).    ECOG FS: 0  Skin: warm, dry  HEENT: sclerae anicteric, conjunctivae pink, oropharynx clear. No thrush or mucositis.  Lymph Nodes: No cervical or supraclavicular lymphadenopathy  Lungs: clear to auscultation bilaterally, no rales, wheezes, or rhonci  Heart: regular rate and rhythm  Abdomen: round, soft, non tender, positive bowel sounds  Musculoskeletal: No focal spinal tenderness, no peripheral edema  Neuro: non focal, well oriented, positive affect  Breasts: left breast unremarkable. Right breast status post lumpectomy and radiation. No evidence of local recurrence. Right axilla benign.   LAB RESULTS: Lab Results  Component Value Date   WBC 3.6* 12/13/2013   NEUTROABS 1.9 12/13/2013   HGB 12.6 12/13/2013   HCT 38.2 12/13/2013   MCV 90.7 12/13/2013   PLT 169 12/13/2013      Chemistry      Component Value Date/Time   NA 141 12/13/2013 0949   NA 140 05/27/2012 0932   K 4.2 12/13/2013 0949   K 3.9 05/27/2012 0932   CL 103 12/08/2012 0928   CL 106 05/27/2012 0932   CO2 28 12/13/2013 0949   CO2 30 05/27/2012 0932   BUN 8.7 12/13/2013 0949   BUN 9 05/27/2012 0932   CREATININE 0.8 12/13/2013 0949   CREATININE 0.72 05/27/2012 0932      Component Value Date/Time   CALCIUM 9.3 12/13/2013 0949   CALCIUM 9.5 05/27/2012 0932   ALKPHOS 45 12/13/2013 0949   ALKPHOS 44 05/27/2012 0932   AST 19 12/13/2013 0949   AST 14 05/27/2012 0932   ALT 19 12/13/2013 0949   ALT 14 05/27/2012 0932   BILITOT 0.51  12/13/2013 0949   BILITOT 0.4 05/27/2012 0932      No results found for this basename: LABCA2    No components found with this basename: LABCA125    No results found for this basename: INR,  in the last 168 hours  Urinalysis No results found for this basename: colorurine,  appearanceur,  labspec,  phurine,  glucoseu,  hgbur,  bilirubinur,  ketonesur,  proteinur,  urobilinogen,  nitrite,  leukocytesur    STUDIES: Most recent mammogram on 09/14/13 was unremarkable.  ASSESSMENT: 48 y.o. 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  (  1) Participated in NSABP B-43 study and received Trastuzumab x 2, last dose February 2013  (2) Completed adjuvant radiation 12/26/2011  (3) Started Tamoxifen in 01/2012  PLAN: Kambria is doing well as far as her noninvasive breast cancer is concerned. She is now almost 3 years out from her definitive surgery with no evidence of recurrent disease. She chose not to have labs today, but the results from February of this year were stable. She is still without a primary care physician, and I advised her that a yearly physical is suggested for her age group. She will need a colonoscopy next year.   For her vaginal dryness I gave her a handout with the information to our pelvic floor and intimacy class. She live in [place] so this may not be convenient to her. I mentioned coconut oil and water based lubrication as possible choices. Since she is on tamoxifen, topical estrogens would be fine, but she was uninterested in a prescription for these. We also talked about pharmacological management of her hot flashes, but again she denied.  Lile will return next year for labs and an office visit. She understands and agrees with this plan. She knows the goal of treatment in her case is cure. She has been  encouraged to call with any issues that might arise before her next visit here.   Marcelino Duster   07/26/2014 11:20 AM

## 2014-08-11 ENCOUNTER — Other Ambulatory Visit: Payer: Self-pay | Admitting: Oncology

## 2014-09-04 ENCOUNTER — Encounter: Payer: Self-pay | Admitting: Nurse Practitioner

## 2014-09-05 ENCOUNTER — Other Ambulatory Visit: Payer: Self-pay | Admitting: *Deleted

## 2014-09-05 MED ORDER — VENLAFAXINE HCL ER 37.5 MG PO CP24: ORAL_CAPSULE | ORAL | Status: DC

## 2014-09-05 NOTE — Telephone Encounter (Signed)
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  This RN reviewed with pt above symptoms of menopause as well as tamoxifen induced.  Pt states she is implementing overall healthy lifestyle perspectives " but at my visit with Dr Jannifer Rodney he mentioned prescriptions which may be of help "  This RN discussed medications with known good outcome - clonidine, gabapentin and effexor.  Dawn Barton stated she has felt " more emotional " at times " and really have no issues that would explain why ".  Per discussion [name] would like to implement the effexor.  She understands to start at 37.5mg  dose for 1 week and then increase to 2 tablets (75mg ) and best benefit should be noted in approximately 1 month.

## 2014-09-07 ENCOUNTER — Other Ambulatory Visit (HOSPITAL_COMMUNITY): Payer: Self-pay | Admitting: Unknown Physician Specialty

## 2014-09-07 DIAGNOSIS — Z1231 Encounter for screening mammogram for malignant neoplasm of breast: Secondary | ICD-10-CM

## 2014-09-26 ENCOUNTER — Ambulatory Visit (HOSPITAL_COMMUNITY)
Admission: RE | Admit: 2014-09-26 | Discharge: 2014-09-26 | Disposition: A | Discharge status: Home / Self Care | Payer: 59 | Source: Ambulatory Visit | Attending: Unknown Physician Specialty | Admitting: Unknown Physician Specialty

## 2014-09-26 DIAGNOSIS — Z1231 Encounter for screening mammogram for malignant neoplasm of breast: Secondary | ICD-10-CM | POA: Insufficient documentation

## 2014-12-08 ENCOUNTER — Other Ambulatory Visit: Payer: Self-pay | Admitting: *Deleted

## 2014-12-09 ENCOUNTER — Encounter (HOSPITAL_COMMUNITY): Payer: Self-pay | Admitting: Emergency Medicine

## 2014-12-09 ENCOUNTER — Emergency Department (HOSPITAL_COMMUNITY)
Admission: EM | Admit: 2014-12-09 | Discharge: 2014-12-09 | Disposition: A | Discharge status: Home / Self Care | Payer: 59 | Attending: Emergency Medicine | Admitting: Emergency Medicine

## 2014-12-09 DIAGNOSIS — Z87891 Personal history of nicotine dependence: Secondary | ICD-10-CM | POA: Diagnosis not present

## 2014-12-09 DIAGNOSIS — Z853 Personal history of malignant neoplasm of breast: Secondary | ICD-10-CM | POA: Diagnosis not present

## 2014-12-09 DIAGNOSIS — N390 Urinary tract infection, site not specified: Secondary | ICD-10-CM | POA: Insufficient documentation

## 2014-12-09 DIAGNOSIS — R5383 Other fatigue: Secondary | ICD-10-CM | POA: Diagnosis not present

## 2014-12-09 DIAGNOSIS — M791 Myalgia: Secondary | ICD-10-CM | POA: Insufficient documentation

## 2014-12-09 DIAGNOSIS — R1031 Right lower quadrant pain: Secondary | ICD-10-CM | POA: Diagnosis present

## 2014-12-09 DIAGNOSIS — Z79899 Other long term (current) drug therapy: Secondary | ICD-10-CM | POA: Diagnosis not present

## 2014-12-09 DIAGNOSIS — R110 Nausea: Secondary | ICD-10-CM | POA: Insufficient documentation

## 2014-12-09 LAB — URINALYSIS, ROUTINE W REFLEX MICROSCOPIC
Bilirubin Urine: NEGATIVE
Glucose, UA: NEGATIVE mg/dL
Ketones, ur: NEGATIVE mg/dL
NITRITE: NEGATIVE
Protein, ur: NEGATIVE mg/dL
Specific Gravity, Urine: <1.005 — ABNORMAL LOW (ref 1.005–1.030)
Urobilinogen, UA: 0.2 mg/dL (ref 0.0–1.0)
pH: 6 (ref 5.0–8.0)

## 2014-12-09 LAB — CBC WITH DIFFERENTIAL/PLATELET
Basophils Absolute: 0 10*3/uL (ref 0.0–0.1)
Basophils Relative: 0 % (ref 0–1)
Eosinophils Absolute: 0.1 10*3/uL (ref 0.0–0.7)
Eosinophils Relative: 1 % (ref 0–5)
HCT: 35.6 % — ABNORMAL LOW (ref 36.0–46.0)
Hemoglobin: 12.1 g/dL (ref 12.0–15.0)
LYMPHS ABS: 1.5 10*3/uL (ref 0.7–4.0)
LYMPHS PCT: 16 % (ref 12–46)
MCH: 30.9 pg (ref 26.0–34.0)
MCHC: 34 g/dL (ref 30.0–36.0)
MCV: 90.8 fL (ref 78.0–100.0)
Monocytes Absolute: 1.3 10*3/uL — ABNORMAL HIGH (ref 0.1–1.0)
Monocytes Relative: 15 % — ABNORMAL HIGH (ref 3–12)
Neutro Abs: 6.2 10*3/uL (ref 1.7–7.7)
Neutrophils Relative %: 68 % (ref 43–77)
Platelets: 183 10*3/uL (ref 150–400)
RBC: 3.92 MIL/uL (ref 3.87–5.11)
RDW: 13.2 % (ref 11.5–15.5)
WBC: 9 10*3/uL (ref 4.0–10.5)

## 2014-12-09 LAB — BASIC METABOLIC PANEL
Anion gap: 3 — ABNORMAL LOW (ref 5–15)
BUN: 9 mg/dL (ref 6–23)
CALCIUM: 8.6 mg/dL (ref 8.4–10.5)
CO2: 28 mmol/L (ref 19–32)
Chloride: 105 mmol/L (ref 96–112)
Creatinine, Ser: 0.79 mg/dL (ref 0.50–1.10)
Glucose, Bld: 114 mg/dL — ABNORMAL HIGH (ref 70–99)
POTASSIUM: 3.4 mmol/L — AB (ref 3.5–5.1)
Sodium: 136 mmol/L (ref 135–145)

## 2014-12-09 LAB — URINE MICROSCOPIC-ADD ON

## 2014-12-09 MED ORDER — SODIUM CHLORIDE 0.9 % IV SOLN: 1000.0000 mL | INTRAVENOUS | Status: DC

## 2014-12-09 MED ORDER — OXYCODONE-ACETAMINOPHEN 5-325 MG PO TABS: 1.0000 | ORAL_TABLET | Freq: Four times a day (QID) | ORAL | Status: DC | PRN

## 2014-12-09 MED ORDER — CIPROFLOXACIN HCL 500 MG PO TABS: 500.0000 mg | ORAL_TABLET | Freq: Two times a day (BID) | ORAL | Status: DC

## 2014-12-09 MED ORDER — DOXYCYCLINE HYCLATE 100 MG PO CAPS: 100.0000 mg | ORAL_CAPSULE | Freq: Two times a day (BID) | ORAL | Status: DC

## 2014-12-09 MED ORDER — SODIUM CHLORIDE 0.9 % IV SOLN
1000.0000 mL | Freq: Once | INTRAVENOUS | Status: AC
  Administered 2014-12-09: 1000 mL via INTRAVENOUS

## 2014-12-09 MED ORDER — DICLOFENAC SODIUM 75 MG PO TBEC: 75.0000 mg | DELAYED_RELEASE_TABLET | Freq: Two times a day (BID) | ORAL | Status: DC

## 2014-12-09 NOTE — ED Notes (Signed)
PT c/o fever and general malaise x4 days with body aches. PT states she went to MD yesterday and stated she was dx with kidney infection and was given Rocephin 1 gram IM and started on Bactrim. PT c/o fever x4 days and had tylenol at 0315 this am.

## 2014-12-09 NOTE — ED Provider Notes (Signed)
CSN: [id number]     Arrival date & time 12/09/14  0807 History   First MD Initiated Contact with Patient 12/09/14 0809     Chief Complaint  Patient presents with  . Flank Pain     (Consider location/radiation/quality/duration/timing/severity/associated sxs/prior Treatment) Patient is a 49 y.o. female presenting with flank pain. The history is provided by the patient.  Flank Pain This is a new problem. The current episode started in the past 7 days. The problem occurs intermittently. The problem has been gradually worsening. Associated symptoms include fatigue, a fever, headaches, myalgias and nausea. Pertinent negatives include no abdominal pain, arthralgias, chest pain, coughing or neck pain. Nothing aggravates the symptoms. She has tried rest and acetaminophen (one day of antibiotics) for the symptoms. The treatment provided no relief.    Past Medical History  Diagnosis Date  . Complication of anesthesia     slow to wake up  . 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 Radiation 11/12/2011-12/26/2011    5040 cGy   Past Surgical History  Procedure Laterality Date  . Tubal ligation    . Breast surgery  2003    breast lift  . Breast lumpectomy  10/02/2011    Procedure: LUMPECTOMY;  Surgeon: Marcello Moores A. Cornett, MD;  Location: [facility];  Service: General;  Laterality: Right;  right breast lumpectomy  . Lumbar disc surgery  08/2012    L4-5 microdiscectomy    Family History  Problem Relation Age of Onset  . COPD Mother   . Atrial fibrillation Mother   . Cancer Father     NSCLC  . Sleep apnea Brother    History  Substance Use Topics  . Smoking status: Former Research scientist (life sciences)  . Smokeless tobacco: Never Used  . Alcohol Use: Yes     Comment: social drinker   OB History    Gravida Para Term Preterm AB TAB SAB Ectopic Multiple Living   2 2              Obstetric Comments   Menarche age 42, ist pregnancy age 64 . 22 vah=ginal  births, 2 daughters     Review of Systems  Constitutional: Positive for fever, activity change and fatigue.       All ROS Neg except as noted in HPI  HENT: Negative for nosebleeds.   Eyes: Negative for photophobia and discharge.  Respiratory: Negative for cough, shortness of breath and wheezing.   Cardiovascular: Negative for chest pain and palpitations.  Gastrointestinal: Positive for nausea. Negative for abdominal pain and blood in stool.  Genitourinary: Positive for flank pain. Negative for dysuria, frequency and hematuria.  Musculoskeletal: Positive for myalgias. Negative for back pain, arthralgias and neck pain.  Skin: Negative.   Neurological: Positive for headaches. Negative for dizziness, seizures and speech difficulty.  Psychiatric/Behavioral: Negative for hallucinations and confusion.      Allergies  Review of patient's allergies indicates no known allergies.  Home Medications   Prior to Admission medications   Medication Sig Start Date End Date Taking? Authorizing Provider  COLLAGEN PO Take 3 tablets by mouth daily.    Historical Provider, MD  ipratropium (ATROVENT) 0.06 % nasal spray Place 2 sprays into the nose 4 (four) times daily. 06/27/13   Billy Fischer, MD  naproxen sodium (ANAPROX) 220 MG tablet Take 220 mg by mouth 2 (two) times daily with a meal.    Historical Provider, MD  tamoxifen (NOLVADEX) 20 MG tablet  TAKE 1 TABLET BY MOUTH ONCE DAILY 08/11/14   Chauncey Cruel, MD  venlafaxine XR (EFFEXOR-XR) 37.5 MG 24 hr capsule Take 1 tablet daily x 7 days then may increase to 2 tabs a day. 09/05/14   Chauncey Cruel, MD   BP 129/72 mmHg  Pulse 86  Temp(Src) 98.6 F (37 C) (Oral)  Resp 18  Ht 5\' 5"  (1.651 m)  Wt 174 lb (78.926 kg)  BMI 28.96 kg/m2  SpO2 96% Physical Exam  Constitutional: She is oriented to person, place, and time. She appears well-developed and well-nourished.  Non-toxic appearance.  HENT:  Head: Normocephalic.  Right Ear: Tympanic  membrane and external ear normal.  Left Ear: Tympanic membrane and external ear normal.  Eyes: EOM and lids are normal. Pupils are equal, round, and reactive to light.  Neck: Normal range of motion. Neck supple. Carotid bruit is not present.  Cardiovascular: Normal rate, regular rhythm, normal heart sounds, intact distal pulses and normal pulses.   Pulmonary/Chest: Breath sounds normal. No respiratory distress. She has no wheezes. She has no rales.  Abdominal: Soft. Bowel sounds are normal. There is no hepatosplenomegaly. There is tenderness in the right lower quadrant and left lower quadrant. There is CVA tenderness. There is no rebound and no guarding.  Musculoskeletal: Normal range of motion.  Lymphadenopathy:       Head (right side): No submandibular adenopathy present.       Head (left side): No submandibular adenopathy present.    She has no cervical adenopathy.  Neurological: She is alert and oriented to person, place, and time. She has normal strength. No cranial nerve deficit or sensory deficit.  Skin: Skin is warm and dry.  Psychiatric: She has a normal mood and affect. Her speech is normal.  Nursing note and vitals reviewed.   ED Course  Procedures (including critical care time) Labs Review Labs Reviewed  URINE CULTURE  URINALYSIS, ROUTINE W REFLEX MICROSCOPIC  CBC WITH DIFFERENTIAL/PLATELET  BASIC METABOLIC PANEL    Imaging Review No results found.   EKG Interpretation None      MDM  Vital signs wnl. Patient tolerated IV fluids without problem. States she feels some better after the IV fluids.  Recheck of vital signs is well within normal limits.  Urinalysis reveals a clear yellow specimen with a specific gravity of 1.005, there is moderate hemoglobin present, small leukocyte esterase, 3-6 white blood cells, and 3-6 red blood cells with a few bacteria. No high fever, no vomiting, and review of the urine analysis, doubt pyelonephritis , obstruction, or renal  calculi.  Complete blood count is within normal limits, with white blood cell count improved compared to the one done on yesterday per the patient. The white blood cell yesterday was 10,000, today white blood cell count is 9000. The basic metabolic panel shows a potassium be slightly low at 3.4, otherwise well within normal limits. In particular the renal function is fine at BUN of 9, and creatinine of 0.79. Anion gap is low at 3.  Urinary tract infection seems to be improving, after 1 day of antibiotic therapy. I have informed the patient of the lab findings as well as the vital signs. Encouraged patient to increase fluids, continue her antibiotic medications, and to use Tylenol and ibuprofen for fever and aching. Patient is been given instructions to return to the emergency department if temperature would not respond to ibuprofen or Tylenol, if there is excessive vomiting, or if there is deterioration in her general  condition. Patient acknowledges understanding of these discharge instructions.      Final diagnoses:  Abscess of left breast    *I have reviewed nursing notes, vital signs, and all appropriate lab and imaging results for this patient.**    Lenox Ahr, PA-C 12/09/14 [place], [place] 12/09/14 1114  Tanna Furry, MD 12/09/14 1520

## 2014-12-09 NOTE — Discharge Instructions (Signed)
Please increase fluids. Please use your Bactrim as previously prescribed. Please use ibuprofen every 6 hours, and/or Tylenol every 4 hours for fever or aching. Please return to the emergency department or see your primary physician if not improving. Urinary Tract Infection Urinary tract infections (UTIs) can develop anywhere along your urinary tract. Your urinary tract is your body's drainage system for removing wastes and extra water. Your urinary tract includes two kidneys, two ureters, a bladder, and a urethra. Your kidneys are a pair of bean-shaped organs. Each kidney is about the size of your fist. They are located below your ribs, one on each side of your spine. CAUSES Infections are caused by microbes, which are microscopic organisms, including fungi, viruses, and bacteria. These organisms are so small that they can only be seen through a microscope. Bacteria are the microbes that most commonly cause UTIs. SYMPTOMS  Symptoms of UTIs may vary by age and gender of the patient and by the location of the infection. Symptoms in young women typically include a frequent and intense urge to urinate and a painful, burning feeling in the bladder or urethra during urination. Older women and men are more likely to be tired, shaky, and weak and have muscle aches and abdominal pain. A fever may mean the infection is in your kidneys. Other symptoms of a kidney infection include pain in your back or sides below the ribs, nausea, and vomiting. DIAGNOSIS To diagnose a UTI, your caregiver will ask you about your symptoms. Your caregiver also will ask to provide a urine sample. The urine sample will be tested for bacteria and white blood cells. White blood cells are made by your body to help fight infection. TREATMENT  Typically, UTIs can be treated with medication. Because most UTIs are caused by a bacterial infection, they usually can be treated with the use of antibiotics. The choice of antibiotic and length of  treatment depend on your symptoms and the type of bacteria causing your infection. HOME CARE INSTRUCTIONS  If you were prescribed antibiotics, take them exactly as your caregiver instructs you. Finish the medication even if you feel better after you have only taken some of the medication.  Drink enough water and fluids to keep your urine clear or pale yellow.  Avoid caffeine, tea, and carbonated beverages. They tend to irritate your bladder.  Empty your bladder often. Avoid holding urine for long periods of time.  Empty your bladder before and after sexual intercourse.  After a bowel movement, women should cleanse from front to back. Use each tissue only once. SEEK MEDICAL CARE IF:   You have back pain.  You develop a fever.  Your symptoms do not begin to resolve within 3 days. SEEK IMMEDIATE MEDICAL CARE IF:   You have severe back pain or lower abdominal pain.  You develop chills.  You have nausea or vomiting.  You have continued burning or discomfort with urination. MAKE SURE YOU:   Understand these instructions.  Will watch your condition.  Will get help right away if you are not doing well or get worse. Document Released: 07/30/2005 Document Revised: 04/20/2012 Document Reviewed: 11/28/2011 [facility] Patient Information 2015 [place], [place]. This information is not intended to replace advice given to you by your health care provider. Make sure you discuss any questions you have with your health care provider.

## 2014-12-11 LAB — URINE CULTURE
COLONY COUNT: NO GROWTH
Culture: NO GROWTH

## 2015-02-09 ENCOUNTER — Telehealth: Payer: Self-pay | Admitting: Oncology

## 2015-02-09 NOTE — Telephone Encounter (Signed)
Due to PAL moved 9/27 lab and f/u to 9/20. Left message for patient and mailed schedule.

## 2015-03-14 ENCOUNTER — Encounter: Payer: 59 | Attending: "Endocrinology | Admitting: Nutrition

## 2015-03-14 VITALS — Ht 66.0 in | Wt 179.4 lb

## 2015-03-14 DIAGNOSIS — E663 Overweight: Secondary | ICD-10-CM | POA: Diagnosis present

## 2015-03-14 DIAGNOSIS — Z6828 Body mass index (BMI) 28.0-28.9, adult: Secondary | ICD-10-CM | POA: Diagnosis not present

## 2015-03-14 DIAGNOSIS — Z713 Dietary counseling and surveillance: Secondary | ICD-10-CM | POA: Insufficient documentation

## 2015-03-14 NOTE — Progress Notes (Signed)
  Medical Nutrition Therapy:  Appt start time: 0830 end time:  0930.  Assessment:  Primary concerns today: overweight. She lives with her husband. She is a Marine scientist and works long shifts. They eat out some. She is very physically active.Run 3-4 times per week:  10 miles. Ran 1/2 marathon. Has back problems at times. S/p breast cancer and currently treated for Thyroid problems. Wants to lose 10-15 lbs.. Gained 10 lbs over the last 5 years. Diet is excessive in calories from various foods along with occasional snacks between meals or late night meals due to work schedule. Needs more protein and higher fiber diet and cut out snacks between meals.   Preferred Learning Style:   Auditory  Visual  Hands on  Learning Readiness:     Ready  Change in progress   MEDICATIONS:   DIETARY INTAKE:   24-hr recall:  B ( AM):  Oatmeal- sugar free instant, 1 pk and apple, peanut butter, or yogurt,  coffee Snk ( AM):  None unless at work:  [employer] and peanut butter  L ( PM): Tuna on multigrain rounds and baked chipes, Unsweet tea Snk ( PM):  Protein bar almond/chocolate D ( PM):  Meatloaf, mashed potatoes, green bean unsweet tea Snk ( PM): Usually; cookies, or oreos,   Beverages: unsweet tea, almond milk  Usual physical activity: rides bike,   Estimated energy needs: 1500-1800 calories 170 g carbohydrates 105 g protein 39 g fat  Progress Towards Goal(s):  In progress.   Nutritional Diagnosis:  NB-1.1 Food and nutrition-related knowledge deficit As related to overweight.  As evidenced by BMI 29.    Intervention:  Nutrition counseling on healthy weight loss tips, meal planning, MY Plate, portion sizes, high fiber low fat higher protein diet and benefits of exercise. Reading food labels and avoiding snacks and empty calories..  Goal:  1. Eat three balanced meals per day. 2. Avoid snacks between meals. 3. Increase fresh fruits and vegetables daily. 4. Increase protein with  meals 5. Avoid cakes, cookies and empty calore foods. 6. Consider protein bar or protein drink if needed for meal  7. Avoid eating after 7 pm if possible. 8. Lose 2-3 lbs per month. 9. Keep a food journal and bring at next visit.  Teaching Method Utilized:  Visual Auditory Hands on  Handouts given during visit include:  The Plate Method  The Meal Pla  Barriers to learning/adherence to lifestyle change: none  Demonstrated degree of understanding via:  Teach Back   Monitoring/Evaluation:  Dietary intake, exercise, meal planning, and body weight in 1 month(s).

## 2015-03-14 NOTE — Patient Instructions (Signed)
Goal:  1. Eat three balanced meals per day. 2. Avoid snacks between meals. 3. Increase fresh fruits and vegetables daily. 4. Increase protein with meals 5. Avoid cakes, cookies and empty calore foods. 6. Consider protein bar or protein drink if needed for meal  7. Avoid eating after 7 pm if possible. 8. Lose 2-3 lbs per month. 9. Keep a food journal and bring at next visit.

## 2015-07-24 ENCOUNTER — Telehealth: Payer: Self-pay | Admitting: Oncology

## 2015-07-24 ENCOUNTER — Other Ambulatory Visit (HOSPITAL_BASED_OUTPATIENT_CLINIC_OR_DEPARTMENT_OTHER): Payer: 59

## 2015-07-24 ENCOUNTER — Ambulatory Visit (HOSPITAL_BASED_OUTPATIENT_CLINIC_OR_DEPARTMENT_OTHER): Payer: 59 | Admitting: Oncology

## 2015-07-24 VITALS — BP 115/61 | HR 62 | Temp 98.7°F | Resp 18 | Ht 66.0 in | Wt 178.8 lb

## 2015-07-24 DIAGNOSIS — D0511 Intraductal carcinoma in situ of right breast: Secondary | ICD-10-CM | POA: Diagnosis not present

## 2015-07-24 DIAGNOSIS — D0510 Intraductal carcinoma in situ of unspecified breast: Secondary | ICD-10-CM

## 2015-07-24 LAB — COMPREHENSIVE METABOLIC PANEL (CC13)
ALT: 23 U/L (ref 0–55)
AST: 22 U/L (ref 5–34)
Albumin: 4.2 g/dL (ref 3.5–5.0)
Alkaline Phosphatase: 43 U/L (ref 40–150)
Anion Gap: 7 mEq/L (ref 3–11)
BILIRUBIN TOTAL: 0.56 mg/dL (ref 0.20–1.20)
BUN: 10.4 mg/dL (ref 7.0–26.0)
CHLORIDE: 105 meq/L (ref 98–109)
CO2: 28 meq/L (ref 22–29)
Calcium: 9.2 mg/dL (ref 8.4–10.4)
Creatinine: 0.8 mg/dL (ref 0.6–1.1)
EGFR: 82 mL/min/{1.73_m2} — AB (ref >90)
GLUCOSE: 91 mg/dL (ref 70–140)
Potassium: 4 mEq/L (ref 3.5–5.1)
SODIUM: 141 meq/L (ref 136–145)
TOTAL PROTEIN: 7 g/dL (ref 6.4–8.3)

## 2015-07-24 LAB — CBC WITH DIFFERENTIAL/PLATELET
BASO%: 0.5 % (ref 0.0–2.0)
Basophils Absolute: 0 10*3/uL (ref 0.0–0.1)
EOS%: 1.5 % (ref 0.0–7.0)
Eosinophils Absolute: 0.1 10*3/uL (ref 0.0–0.5)
HCT: 37.7 % (ref 34.8–46.6)
HGB: 12.9 g/dL (ref 11.6–15.9)
LYMPH%: 34.5 % (ref 14.0–49.7)
MCH: 30.9 pg (ref 25.1–34.0)
MCHC: 34.2 g/dL (ref 31.5–36.0)
MCV: 90.2 fL (ref 79.5–101.0)
MONO#: 0.5 10*3/uL (ref 0.1–0.9)
MONO%: 8.8 % (ref 0.0–14.0)
NEUT%: 54.7 % (ref 38.4–76.8)
NEUTROS ABS: 3 10*3/uL (ref 1.5–6.5)
Platelets: 167 10*3/uL (ref 145–400)
RBC: 4.18 10*6/uL (ref 3.70–5.45)
RDW: 14.1 % (ref 11.2–14.5)
WBC: 5.5 10*3/uL (ref 3.9–10.3)
lymph#: 1.9 10*3/uL (ref 0.9–3.3)

## 2015-07-24 MED ORDER — TAMOXIFEN CITRATE 20 MG PO TABS: 20.0000 mg | ORAL_TABLET | Freq: Every day | ORAL | Status: DC

## 2015-07-24 NOTE — Telephone Encounter (Signed)
Appointments made and avs printed for patient °

## 2015-07-24 NOTE — Progress Notes (Signed)
Dawn Barton  [phone] [phone]) [phone]  OFFICE PROGRESS NOTE    ID: Dawn Barton   DOB: [birth year]  MR#: [id number]  ZHY#:[id number]   PCP: Dawn Duty, PA GYN: Dawn Barton, M.D. SU: Dawn Barton, M.D. RAD ONC: Dawn Barton, M.D. SU: Dawn Barton, M.D.  CHIEF COMPLAINT: right breast cancer CURRENT TREATMENT: tamoxifen 64m daily  BREAST CANCER HISTORY: From Dawn Barton's 10/20/2011 note:  "Presents with an abnormal mammogram. She has undergone annual mammography. [place] A cluster of right breast calcifications were seen in the lower outer quadrant of the right breast. 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 This showed low-grade DCIS, grade 1, with clear surgical margins. There were a few foci of in situ carcinoma ranging from 0.1 2.3 cm in and around the previous biopsy cavity. The patient had an unremarkable postoperative course."   The patient's subsequent history is as detailed below.  INTERVAL HISTORY:  PBriasiareturns for follow up of her ductal carcinoma in situ. She continues on tamoxifen with excellent tolerance. Last year she was bothered by hot flashes and she was prescribed venlafaxine, but she actually never started it. Nevertheless the hot flashes pretty much drifted off and there are not a major concern at present. She does have some vaginal wetness which is very intermittent. She obtains a drug at a very good price.  REVIEW OF SYSTEMS: [name] a bladder or possibly kidney infection in February of this year. There was treated initially with Rocephin plus or minus amoxicillin (she does not recall the name of the antibiotic) and she did require IV fluids 1. After that things cleared up and she has not had any further symptoms. She ran a half  marathon last week. A detailed review of systems today was otherwise negative.  PAST MEDICAL HISTORY: Past Medical History  Diagnosis Date  . Complication of anesthesia     slow to wake up  . 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 Radiation 11/12/2011-12/26/2011    5040 cGy    PAST SURGICAL HISTORY: Past Surgical History  Procedure Laterality Date  . Tubal ligation    . Breast surgery  2003    breast lift  . Breast lumpectomy  10/02/2011    Procedure: LUMPECTOMY;  Surgeon: TMarcello MooresA. Cornett, MD;  [place] [facility]  Service: General;  Laterality: Right;  right breast lumpectomy  . Lumbar disc surgery  08/2012    L4-5 microdiscectomy      FAMILY HISTORY Family History  Problem Relation Age of Onset  . COPD Mother   . Atrial fibrillation Mother   . Cancer Father     NSCLC  . Sleep apnea Brother   The patient's father died from small cell lung cancer at the age of 717 The patient's mother is living, in her mid 755s The patient has one brother, no sisters. There is no history of breast or ovarian cancer in the family.   GYNECOLOGIC HISTORY: Menarche age 49 first live birth age 89 She is GX P2. Last menstrual cycle was in 01/2011. She never took hormone replacement but she did take birth control pills for approximately 15 years with no complications.   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  Her husband Dawn Barton works in Press photographer for and [employer]. Daughter Dawn Barton is a Air cabin crew in [place]. Daughter Dawn Barton is studying audiology. The patient has 1 grandchild. She attends a [place].  ADVANCED DIRECTIVES: Not in place  HEALTH MAINTENANCE: Social History  Substance Use Topics  . Smoking status: Former Research scientist (life sciences)  . Smokeless tobacco: Never Used  . Alcohol Use: Yes     Comment: social drinker     Colonoscopy:  N/A  PAP: N/A  Bone density: N/A  Lipid panel: N/A  No Known Allergies  Current Outpatient Prescriptions  Medication Sig Dispense Refill  . acetaminophen (TYLENOL) 500 MG tablet Take 1,000 mg by mouth every 4 (four) hours as needed for moderate pain.    . COLLAGEN PO Take 3 tablets by mouth daily.    Marland Kitchen ibuprofen (ADVIL,MOTRIN) 200 MG tablet Take 400 mg by mouth every 4 (four) hours as needed for moderate pain.    Marland Kitchen ipratropium (ATROVENT) 0.06 % nasal spray Place 2 sprays into the nose 4 (four) times daily. (Patient not taking: Reported on 12/09/2014) 15 mL 1  . KRILL OIL PO Take 1 capsule by mouth daily.    . naproxen sodium (ANAPROX) 220 MG tablet Take 220 mg by mouth 2 (two) times daily with a meal.    . tamoxifen (NOLVADEX) 20 MG tablet TAKE 1 TABLET BY MOUTH ONCE DAILY 30 tablet PRN  . venlafaxine XR (EFFEXOR-XR) 37.5 MG 24 hr capsule Take 1 tablet daily x 7 days then may increase to 2 tabs a day. (Patient not taking: Reported on 12/09/2014) 60 capsule 6   No current facility-administered medications for this visit.    OBJECTIVE: Middle-aged white woman who appears well Filed Vitals:   07/24/15 1356  BP: 115/61  Pulse: 62  Temp: 98.7 F (37.1 C)  Resp: 18     Body mass index is 28.87 kg/(m^2).    ECOG FS: 0  Sclerae unicteric, EOMs intact Oropharynx clear, dentition in good repair No cervical or supraclavicular adenopathy Lungs no rales or rhonchi Heart regular rate and rhythm Abd soft, nontender, positive bowel sounds MSK no focal spinal tenderness, no upper extremity lymphedema Neuro: nonfocal, well oriented, appropriate affect Breasts: The right breast is status post lumpectomy and radiation. There is no evidence of local recurrence. The right axilla is benign. The left breast is unremarkable.   LAB RESULTS: Lab Results  Component Value Date   WBC 5.5 07/24/2015   NEUTROABS 3.0 07/24/2015   HGB 12.9 07/24/2015   HCT 37.7 07/24/2015   MCV 90.2 07/24/2015   PLT 167  07/24/2015      Chemistry      Component Value Date/Time   NA 136 12/09/2014 0855   NA 141 12/13/2013 0949   K 3.4* 12/09/2014 0855   K 4.2 12/13/2013 0949   CL 105 12/09/2014 0855   CL 103 12/08/2012 0928   CO2 28 12/09/2014 0855   CO2 28 12/13/2013 0949   BUN 9 12/09/2014 0855   BUN 8.7 12/13/2013 0949   CREATININE 0.79 12/09/2014 0855   CREATININE 0.8 12/13/2013 0949      Component Value Date/Time   CALCIUM 8.6 12/09/2014 0855   CALCIUM 9.3 12/13/2013 0949   ALKPHOS 45 12/13/2013 0949   ALKPHOS 44 05/27/2012 0932   AST 19 12/13/2013 0949   AST 14 05/27/2012 0932   ALT 19 12/13/2013 0949   ALT 14 05/27/2012 0932   BILITOT 0.51 12/13/2013  0949   BILITOT 0.4 05/27/2012 0932      No results found for: LABCA2  No components found for: [id number]  No results for input(s): INR in the last 168 hours.  Urinalysis    Component Value Date/Time   COLORURINE YELLOW 12/09/2014 0900    STUDIES: CLINICAL DATA: Patient with history of right breast lumpectomy 2012.  EXAM: DIGITAL DIAGNOSTIC BILATERAL MAMMOGRAM WITH CAD  COMPARISON: [facility]  ACR Breast Density Category b: There are scattered areas of fibroglandular density.  FINDINGS: Stable postlumpectomy changes right breast. No concerning masses, calcifications or architectural distortion identified within either breast.  Mammographic images were processed with CAD.  IMPRESSION: No mammographic evidence for malignancy.  RECOMMENDATION: Bilateral diagnostic mammography in 12 months.  I have discussed the findings and recommendations with the patient. Results were also provided in writing at the conclusion of the visit. If applicable, a reminder letter will be sent to the patient regarding the next appointment.  BI-RADS CATEGORY 2: Benign.   Electronically Signed  By: Lovey Newcomer M.D.  On: 09/26/2014 13:05   ASSESSMENT: 49 y.o. BRCA negative, [place], [place] woman status post right  lumpectomy 10/02/2011 for a low-grade ductal carcinoma in situ measuring less than 5 mm, estrogen receptor positive and 100%, progesterone receptor positive at 4%.  (1) Participated in NSABP B-43 study and received Trastuzumab x 2, last dose February 2013  (2) Completed adjuvant radiation 12/26/2011  (3) Started Tamoxifen in 01/2012  PLAN: Quierra continues to tolerate tamoxifen well, with minimal side effects. She does have some vaginal discharge issues so today we discussed anastrozole. After briefly reviewing the side effects however she very much prefers to stay on tamoxifen for the remaining 2 years planned.  I encouraged her to go ahead and get her colonoscopy done this year now that she has turned 67.  She will return to see [place] in one year. She will see me again in 2 years from now on at that point she will likely "graduate" from follow-up here. MAGRINAT,GUSTAV C   07/24/2015 2:06 PM

## 2015-07-31 ENCOUNTER — Ambulatory Visit: Payer: 59 | Admitting: Oncology

## 2015-07-31 ENCOUNTER — Other Ambulatory Visit: Payer: 59

## 2015-09-19 ENCOUNTER — Other Ambulatory Visit: Payer: Self-pay | Admitting: Oncology

## 2015-09-19 DIAGNOSIS — D0511 Intraductal carcinoma in situ of right breast: Secondary | ICD-10-CM

## 2015-09-24 ENCOUNTER — Ambulatory Visit (INDEPENDENT_AMBULATORY_CARE_PROVIDER_SITE_OTHER): Payer: 59 | Admitting: "Endocrinology

## 2015-09-24 ENCOUNTER — Encounter: Payer: Self-pay | Admitting: "Endocrinology

## 2015-09-24 VITALS — BP 125/71 | HR 72 | Ht 65.0 in | Wt 182.0 lb

## 2015-09-24 DIAGNOSIS — E038 Other specified hypothyroidism: Secondary | ICD-10-CM | POA: Diagnosis not present

## 2015-09-24 DIAGNOSIS — E039 Hypothyroidism, unspecified: Secondary | ICD-10-CM | POA: Insufficient documentation

## 2015-09-24 MED ORDER — LEVOTHYROXINE SODIUM 100 MCG PO TABS: 100.0000 ug | ORAL_TABLET | Freq: Every day | ORAL | Status: DC

## 2015-09-24 NOTE — Progress Notes (Signed)
Subjective:    Patient ID: Dawn Barton, female    DOB: 05-24-1966, PCP Allwardt, Yetta Flock, PA   Past Medical History  Diagnosis Date  . Complication of anesthesia     slow to wake up  . 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 Radiation 11/12/2011-12/26/2011    5040 cGy   Past Surgical History  Procedure Laterality Date  . Tubal ligation    . Breast surgery  2003    breast lift  . Breast lumpectomy  10/02/2011    Procedure: LUMPECTOMY;  Surgeon: Marcello Moores A. Cornett, MD;  Location: [facility];  Service: General;  Laterality: Right;  right breast lumpectomy  . Lumbar disc surgery  08/2012    L4-5 microdiscectomy    Social History   Social History  . Marital Status: Married    Spouse Name: N/A  . Number of Children: 2  . 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 Smoking status: Former Research scientist (life sciences)  . Smokeless tobacco: Never Used  . Alcohol Use: Yes     Comment: social drinker  . Drug Use: No  . Sexual Activity: Yes    Birth Control/ Protection: Post-menopausal, Surgical     Comment: Tubal ligation   Other Topics Concern  . None   Social History Narrative   Outpatient Encounter Prescriptions as of 09/24/2015  Medication Sig  . citalopram (CELEXA) 10 MG tablet Take 10 mg by mouth daily.  Marland Kitchen levothyroxine (SYNTHROID, LEVOTHROID) 100 MCG tablet Take 1 tablet (100 mcg total) by mouth daily before breakfast.  . tamoxifen (NOLVADEX) 20 MG tablet Take 1 tablet (20 mg total) by mouth daily.  . [DISCONTINUED] levothyroxine (SYNTHROID) 75 MCG tablet Take 1 tablet (75 mcg total) by mouth daily before breakfast.  . [DISCONTINUED] levothyroxine (SYNTHROID, LEVOTHROID) 100 MCG tablet Take 1 tablet (100 mcg total) by mouth daily before breakfast.  . acetaminophen (TYLENOL) 500 MG tablet Take 1,000 mg by mouth every 4 (four) hours as needed for moderate pain.   . COLLAGEN PO Take 3 tablets by mouth daily.  Marland Kitchen ibuprofen (ADVIL,MOTRIN) 200 MG tablet Take 400 mg by mouth every 4 (four) hours as needed for moderate pain.  Marland Kitchen KRILL OIL PO Take 1 capsule by mouth daily.  . naproxen sodium (ANAPROX) 220 MG tablet Take 220 mg by mouth 2 (two) times daily with a meal.   No facility-administered encounter medications on file as of 09/24/2015.   ALLERGIES: No Known Allergies VACCINATION STATUS:  There is no immunization history on file for this patient.  HPI Dawn Barton is a 49 yr old female with medical hx as above. She is here to f/u with labs. Her prior  labs showed strong titres of TPO antibodies, and her most recent labs show under replacement with thyroid hormone.   she complains of inability to lose weight, however she , feels better-preparing for Marathon in March 2017.  she was found to have hypothyroidism in March 2016 after lab work.  She is currently on Synthroid 75 mcg po qam. she is compliant..  She reports radiation therapy for breast cancer in 2012 for 7-8 weeks time. Her daughter has hypothyroidism and type 1 DM.  she denies hx of goiter, nor I131 therapy.    Review of Systems  Constitutional:  +weight gain of 4lbs, no fatigue, no subjective hyperthermia/hypothermia Eyes: no  blurry vision, no xerophthalmia ENT: no sore throat, no nodules palpated in throat, no dysphagia/odynophagia, no hoarseness Cardiovascular: no CP/SOB/palpitations/leg swelling Respiratory: no cough/SOB Gastrointestinal: no N/V/D/C Musculoskeletal: no muscle/joint aches Skin: no rashes Neurological: no tremors/numbness/tingling/dizziness Psychiatric: no depression/anxiety  Objective:    BP 125/71 mmHg  Pulse 72  Ht _0  (1.651 m)  Wt 182 lb (82.555 kg)  BMI 30.29 kg/m2  SpO2 98%  Wt Readings from Last 3 Encounters:  09/24/15 182 lb (82.555 kg)  07/24/15 178 lb 12.8 oz (81.103 kg)  03/14/15 179 lb 6.4 oz (81.375 kg)    Physical  Exam  Constitutional: overweight, in NAD Eyes: PERRLA, EOMI, no exophthalmos ENT: moist mucous membranes, no thyromegaly, no cervical lymphadenopathy Cardiovascular: RRR, No MRG Respiratory: CTA B Gastrointestinal: abdomen soft, NT, ND, BS+ Musculoskeletal: no deformities, strength intact in all 4 Skin: moist, warm, no rashes Neurological: no tremor with outstretched hands, DTR normal in all 4   Results for orders placed or performed in visit on 07/24/15  CBC with Differential  Result Value Ref Range   WBC 5.5 3.9 - 10.3 10e3/uL   NEUT# 3.0 1.5 - 6.5 10e3/uL   HGB 12.9 11.6 - 15.9 g/dL   HCT 37.7 34.8 - 46.6 %   Platelets 167 145 - 400 10e3/uL   MCV 90.2 79.5 - 101.0 fL   MCH 30.9 25.1 - 34.0 pg   MCHC 34.2 31.5 - 36.0 g/dL   RBC 4.18 3.70 - 5.45 10e6/uL   RDW 14.1 11.2 - 14.5 %   lymph# 1.9 0.9 - 3.3 10e3/uL   MONO# 0.5 0.1 - 0.9 10e3/uL   Eosinophils Absolute 0.1 0.0 - 0.5 10e3/uL   Basophils Absolute 0.0 0.0 - 0.1 10e3/uL   NEUT% 54.7 38.4 - 76.8 %   LYMPH% 34.5 14.0 - 49.7 %   MONO% 8.8 0.0 - 14.0 %   EOS% 1.5 0.0 - 7.0 %   BASO% 0.5 0.0 - 2.0 %  Comprehensive metabolic panel (Cmet) - [facility]  Result Value Ref Range   Sodium 141 136 - 145 mEq/L   Potassium 4.0 3.5 - 5.1 mEq/L   Chloride 105 98 - 109 mEq/L   CO2 28 22 - 29 mEq/L   Glucose 91 70 - 140 mg/dl   BUN 10.4 7.0 - 26.0 mg/dL   Creatinine 0.8 0.6 - 1.1 mg/dL   Total Bilirubin 0.56 0.20 - 1.20 mg/dL   Alkaline Phosphatase 43 40 - 150 U/L   AST 22 5 - 34 U/L   ALT 23 0 - 55 U/L   Total Protein 7.0 6.4 - 8.3 g/dL   Albumin 4.2 3.5 - 5.0 g/dL   Calcium 9.2 8.4 - 10.4 mg/dL   Anion Gap 7 3 - 11 mEq/L   EGFR 82 (L) >90 ml/min/1.73 m2   Complete Blood Count (Most recent): Lab Results  Component Value Date   WBC 5.5 07/24/2015   HGB 12.9 07/24/2015   HCT 37.7 07/24/2015   MCV 90.2 07/24/2015   PLT 167 07/24/2015   Chemistry (most recent): Lab Results  Component Value Date   NA 141 07/24/2015   K 4.0  07/24/2015   CL 105 12/09/2014   CO2 28 07/24/2015   BUN 10.4 07/24/2015   CREATININE 0.8 07/24/2015    On 09/17/2015 her TSH was elevated at 12.34, free T4 was 0.99.   Assessment & Plan:   1. Other specified hypothyroidism:  She has well settled dx of hypothyroidism. Etiology likely Hashimoto's thyroiditis. Her repeat  TFTs are c/w under-replacement, she will need higher dose of levothyroxine.  I will increase her levothyroxine to 100 g by mouth every morning.  - We discussed about correct intake of levothyroxine, at fasting, with water, separated by at least 30 minutes from breakfast, and separated by more than 4 hours from calcium, iron, multivitamins, acid reflux medications (PPIs). -Patient is made aware of the fact that thyroid hormone replacement is needed for life, dose to be adjusted by periodic monitoring of thyroid function tests.  her exam is negative for goiter, no need for thyroid ultrasound. - I advised patient to maintain close follow up with their PCP for primary care needs. Follow up plan: Return in about 3 months (around 12/25/2015) for underactive thyroid, follow up with pre-visit labs.  Glade Lloyd, MD Phone: [phone]  Fax: [phone]   09/24/2015, 3:38 PM

## 2015-10-09 ENCOUNTER — Ambulatory Visit (HOSPITAL_COMMUNITY)
Admission: RE | Admit: 2015-10-09 | Discharge: 2015-10-09 | Disposition: A | Discharge status: Home / Self Care | Payer: 59 | Source: Ambulatory Visit | Attending: Oncology | Admitting: Oncology

## 2015-10-09 DIAGNOSIS — Z9889 Other specified postprocedural states: Secondary | ICD-10-CM | POA: Diagnosis not present

## 2015-10-09 DIAGNOSIS — Z923 Personal history of irradiation: Secondary | ICD-10-CM | POA: Diagnosis not present

## 2015-10-09 DIAGNOSIS — D0511 Intraductal carcinoma in situ of right breast: Secondary | ICD-10-CM | POA: Insufficient documentation

## 2015-11-09 ENCOUNTER — Institutional Professional Consult (permissible substitution): Payer: 59 | Admitting: Pulmonary Disease

## 2015-11-13 DIAGNOSIS — E039 Hypothyroidism, unspecified: Secondary | ICD-10-CM | POA: Diagnosis not present

## 2015-11-13 DIAGNOSIS — F4321 Adjustment disorder with depressed mood: Secondary | ICD-10-CM | POA: Diagnosis not present

## 2015-11-19 MED FILL — TAMOXIFEN 20 MG TABLET: 20 | 30 days supply | Qty: 30 | Fill #1

## 2015-11-21 DIAGNOSIS — Z01419 Encounter for gynecological examination (general) (routine) without abnormal findings: Secondary | ICD-10-CM | POA: Diagnosis not present

## 2015-11-21 DIAGNOSIS — Z1212 Encounter for screening for malignant neoplasm of rectum: Secondary | ICD-10-CM | POA: Diagnosis not present

## 2015-12-19 DIAGNOSIS — Z1151 Encounter for screening for human papillomavirus (HPV): Secondary | ICD-10-CM | POA: Diagnosis not present

## 2015-12-19 DIAGNOSIS — Z01419 Encounter for gynecological examination (general) (routine) without abnormal findings: Secondary | ICD-10-CM | POA: Diagnosis not present

## 2015-12-26 MED FILL — LEVOTHYROXINE 25 MCG TABLET: 25 | 30 days supply | Qty: 30 | Fill #2

## 2015-12-26 MED FILL — TAMOXIFEN 20 MG TABLET: 20 | 30 days supply | Qty: 30 | Fill #2

## 2015-12-27 ENCOUNTER — Ambulatory Visit: Payer: 59 | Admitting: "Endocrinology

## 2016-01-08 ENCOUNTER — Telehealth: Payer: Self-pay | Admitting: Gastroenterology

## 2016-01-08 NOTE — Telephone Encounter (Signed)
Pt called asking to be set up for a colonoscopy and stated she was not having any GI problems. Please call [phone]

## 2016-01-10 NOTE — Telephone Encounter (Signed)
Per Dawn Barton call pt at work at [phone].

## 2016-01-10 NOTE — Telephone Encounter (Signed)
LMOM to call.

## 2016-01-10 NOTE — Telephone Encounter (Signed)
I called pt and she was not referred. She has never had a colonoscopy and wants to schedule one. She does have hemorrhoids and rectal bleeding. I told her she would need a referral from her PCP for me to make her an OV appt. She goes to [facility] and will have them send a referral.

## 2016-01-17 NOTE — Telephone Encounter (Signed)
I faxed PCP a request of what we were needing from them.

## 2016-01-17 NOTE — Telephone Encounter (Signed)
Dawn Barton, would you mind checking with PCP for referral for this pt?  They had not sent a referral and she needs office visit prior to scheduling a colonoscopy due to hemorrhoids and rectal bleeding.  Thanks!

## 2016-01-18 ENCOUNTER — Other Ambulatory Visit: Payer: Self-pay | Admitting: "Endocrinology

## 2016-01-21 ENCOUNTER — Encounter: Payer: Self-pay | Admitting: Internal Medicine

## 2016-01-24 MED FILL — TAMOXIFEN 20 MG TABLET: 20 | 30 days supply | Qty: 30 | Fill #3

## 2016-02-01 ENCOUNTER — Ambulatory Visit: Payer: 59 | Admitting: Gastroenterology

## 2016-02-06 IMAGING — MG MM AP DIAG 20 MIN
6 of 9 series · 6 of 25 positions shown · non-contrast
Comparison: Previous exam(s).

CLINICAL DATA: History of right breast cancer in [age] status post
lumpectomy and radiation therapy.

EXAM:
DIGITAL DIAGNOSTIC RIGHT MAMMOGRAM WITH 3D TOMOSYNTHESIS AND CAD

[R CC (1 of 2)]
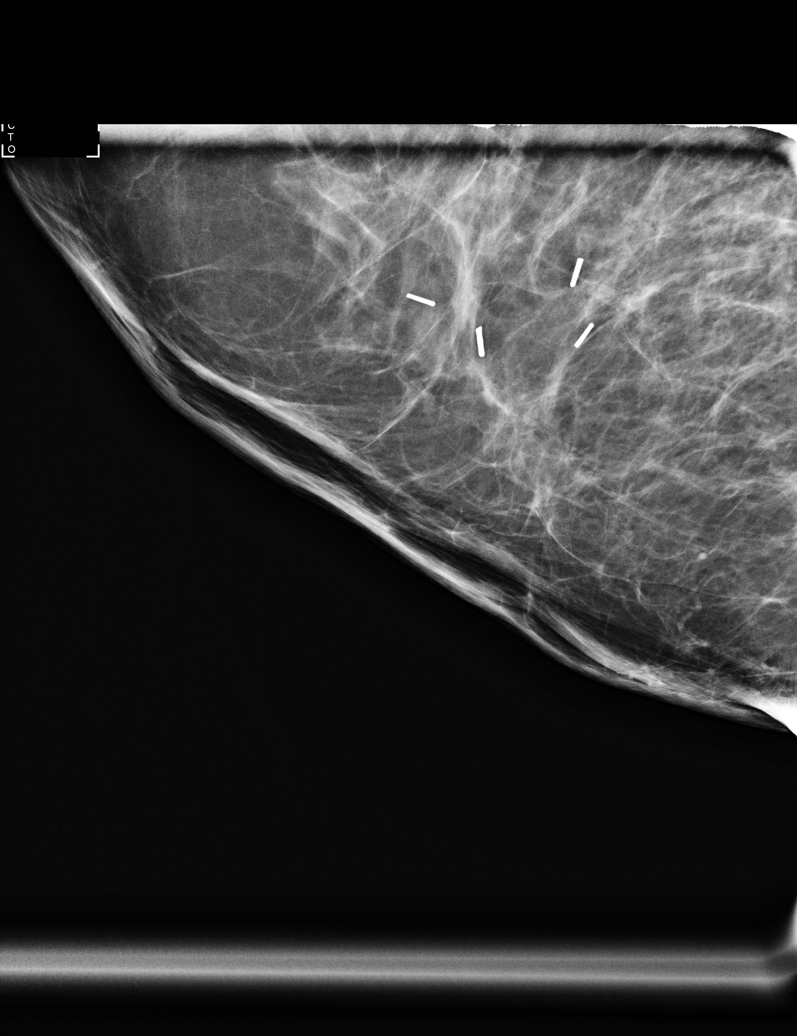

[L CC]
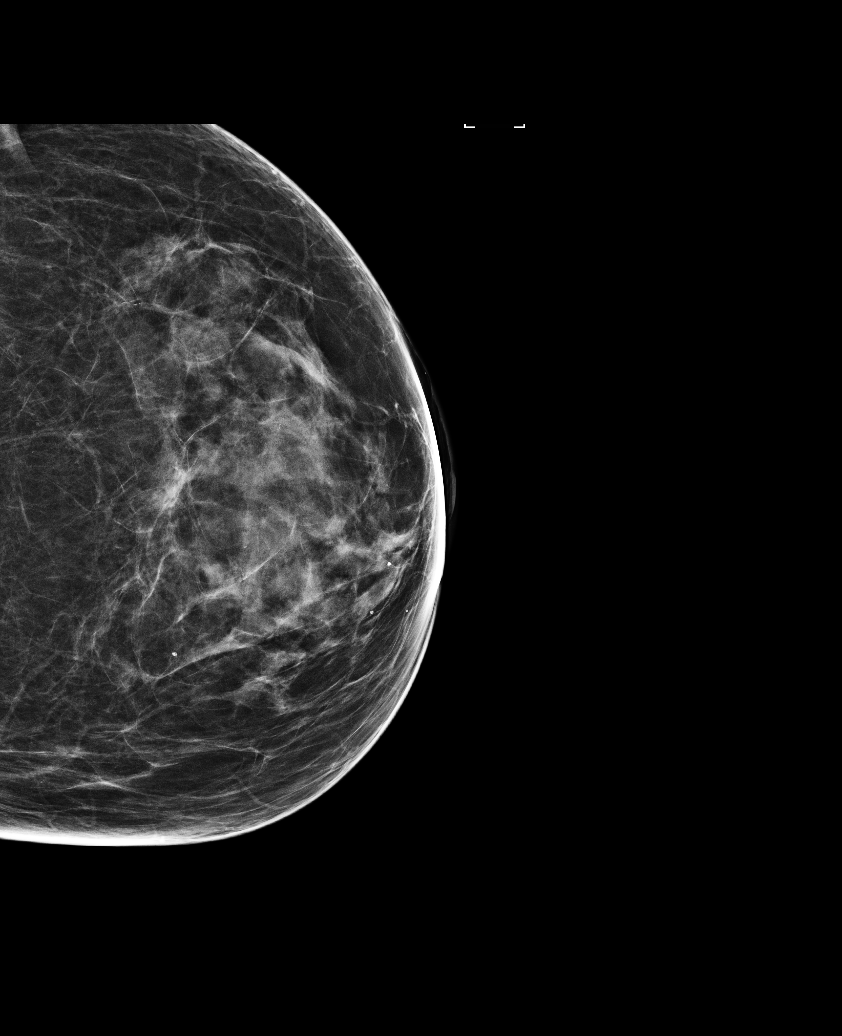

[R CC (2 of 2)]
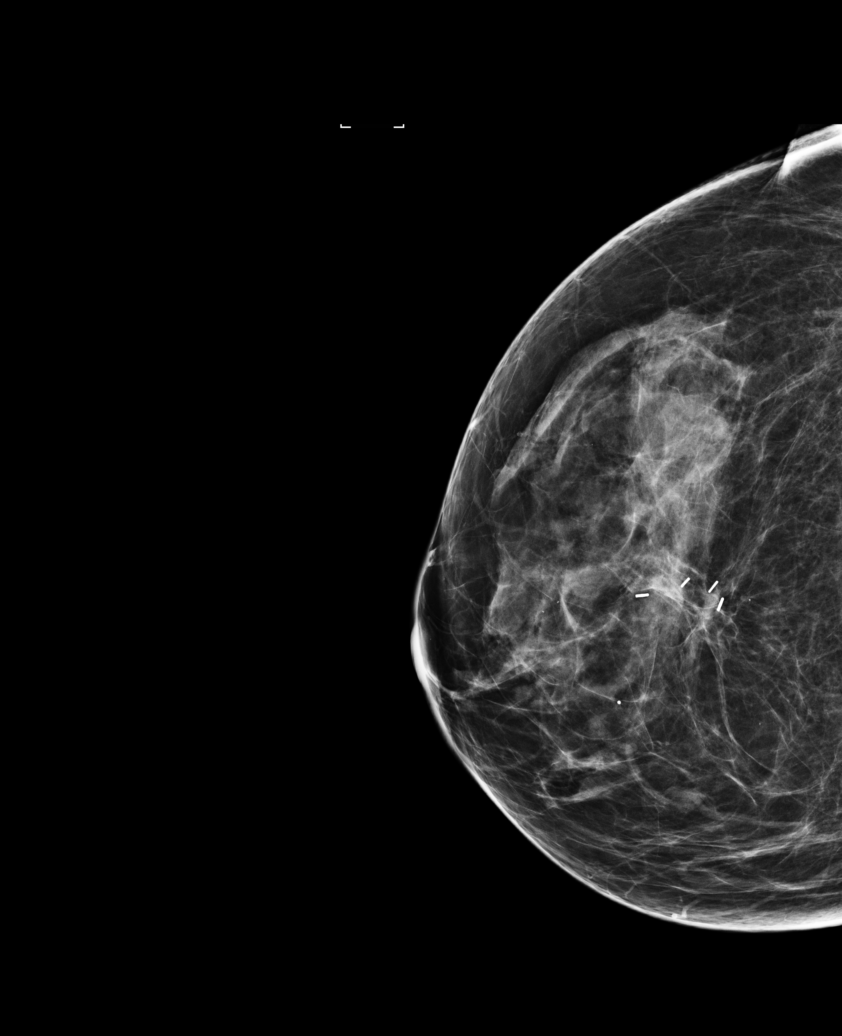

[L MLO]
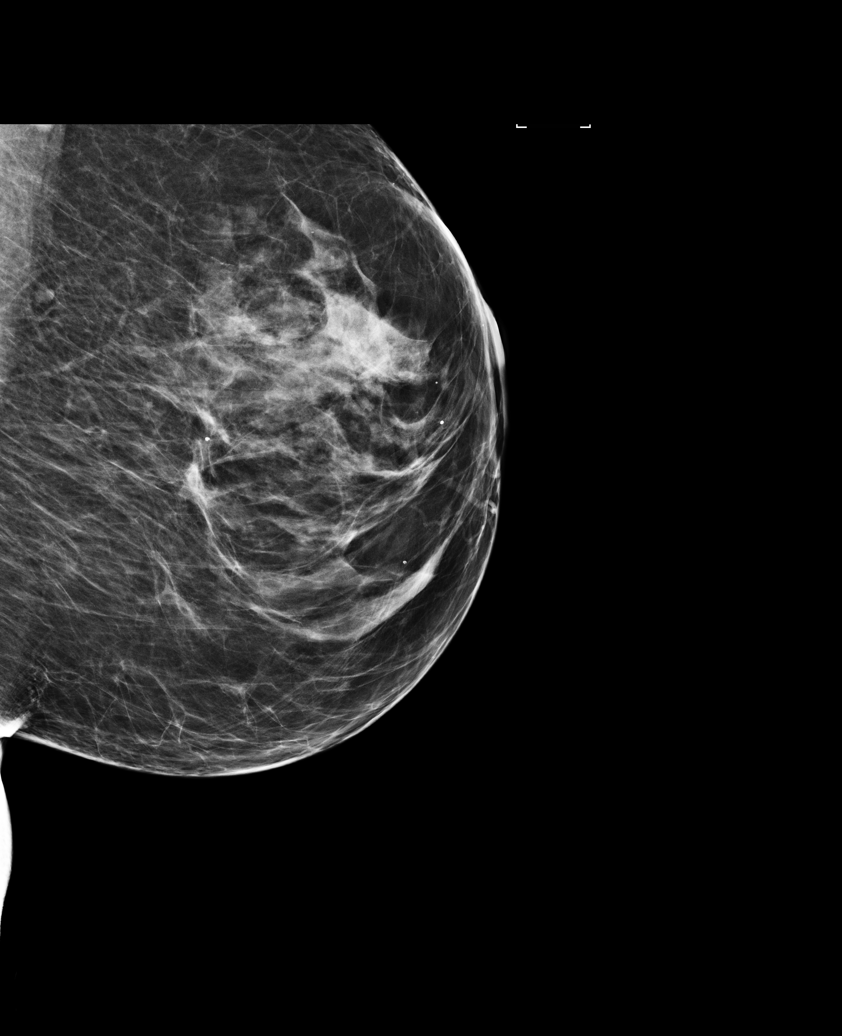

[R MLO]
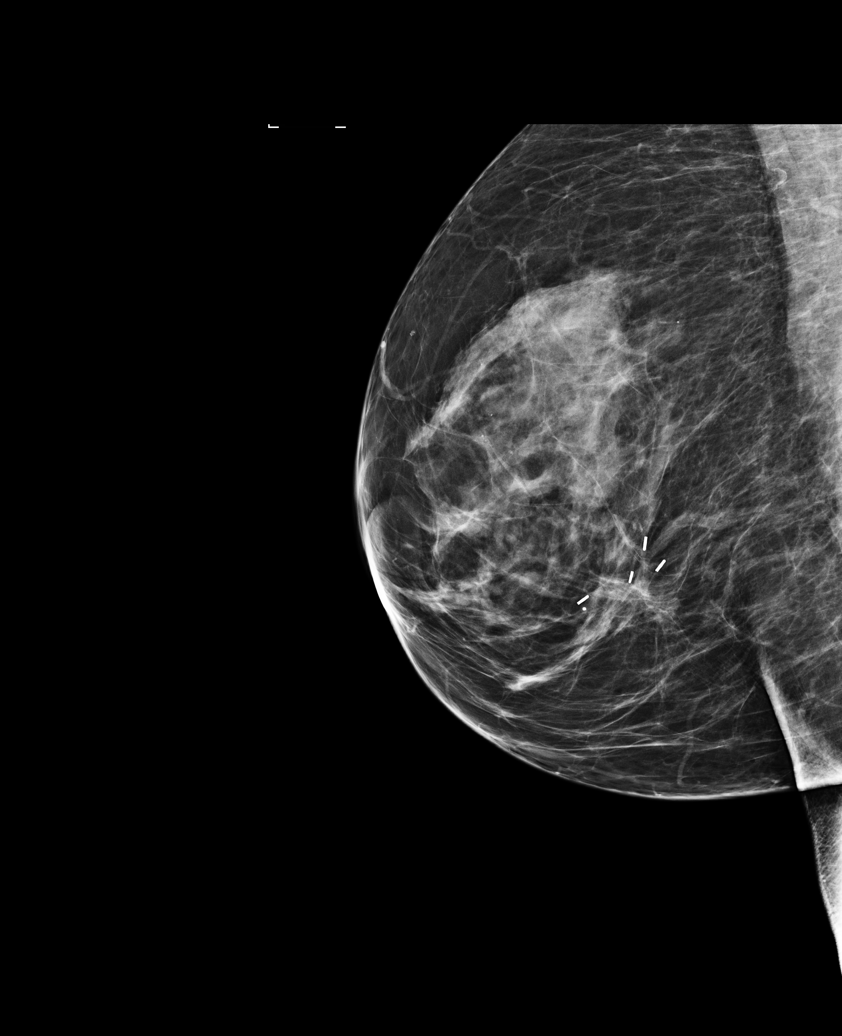

[L CC tomo · tomo slice 35/69.0]
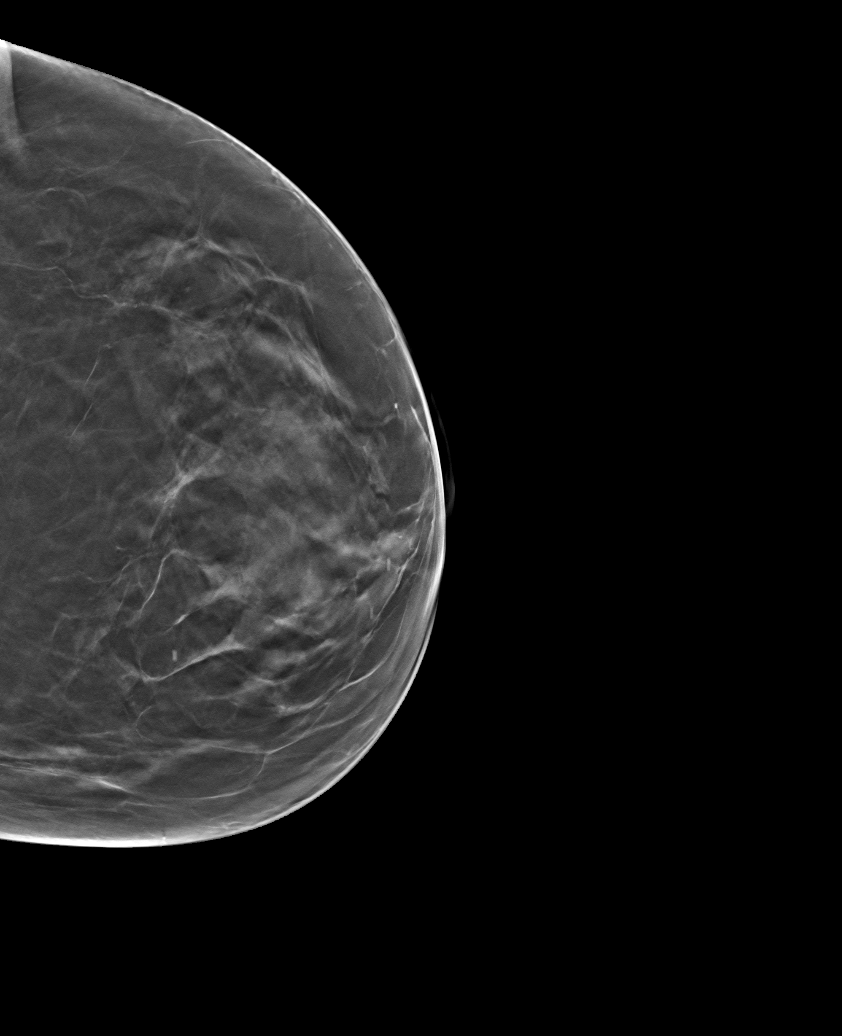

[6 of 25 positions shown; findings below may reference images not displayed]

ACR Breast Density Category c: The breast tissue is heterogeneously
dense, which may obscure small masses.
FINDINGS: There are stable postsurgical changes within the right breast.

There are no new dominant masses, suspicious calcifications or
secondary signs of malignancy within either breast.

Mammographic images were processed with CAD.
IMPRESSION: No evidence of malignancy within either breast. Stable postsurgical
changes in the right breast

RECOMMENDATION:
Bilateral diagnostic mammogram in 1 year.

I have discussed the findings and recommendations with the patient.
Results were also provided in writing at the conclusion of the
visit. If applicable, a reminder letter will be sent to the patient
regarding the next appointment.

BI-RADS CATEGORY  2: Benign.

## 2016-02-25 MED FILL — TAMOXIFEN 20 MG TABLET: 20 | 30 days supply | Qty: 30 | Fill #4

## 2016-02-26 ENCOUNTER — Other Ambulatory Visit: Payer: Self-pay

## 2016-02-26 ENCOUNTER — Encounter: Payer: Self-pay | Admitting: Gastroenterology

## 2016-02-26 ENCOUNTER — Ambulatory Visit (INDEPENDENT_AMBULATORY_CARE_PROVIDER_SITE_OTHER): Payer: 59 | Admitting: Gastroenterology

## 2016-02-26 VITALS — BP 133/65 | HR 66 | Temp 98.3°F | Ht 66.0 in | Wt 177.2 lb

## 2016-02-26 DIAGNOSIS — K589 Irritable bowel syndrome without diarrhea: Secondary | ICD-10-CM | POA: Diagnosis not present

## 2016-02-26 DIAGNOSIS — K625 Hemorrhage of anus and rectum: Secondary | ICD-10-CM

## 2016-02-26 DIAGNOSIS — K649 Unspecified hemorrhoids: Secondary | ICD-10-CM | POA: Insufficient documentation

## 2016-02-26 DIAGNOSIS — R140 Abdominal distension (gaseous): Secondary | ICD-10-CM

## 2016-02-26 MED ORDER — PEG 3350-KCL-NA BICARB-NACL 420 G PO SOLR: 4000.0000 mL | ORAL | Status: DC

## 2016-02-26 MED ORDER — PEG-KCL-NACL-NASULF-NA ASC-C 100 G PO SOLR: 1.0000 | ORAL | Status: DC

## 2016-02-26 NOTE — Assessment & Plan Note (Signed)
50 year old female who presents to schedule first ever colonoscopy. She has history of hemorrhoids, increasing difficulty with bleeding since she has been training for long distance running. She would like to have them "taking care of". Creams are not helping. She also has issues with digestion, abdominal bloating, gas, tendency towards constipation but not lately since she has "cleaned up her diet". Suspect IBS but need to consider celiac disease. Celiac screening labs ordered. Plan on colonoscopy with Dr. Oneida Alar in the near future with hemorrhoid banding. Discussed pros and cons of hemorrhoid banding at time of colonoscopy versus in office banding. Patient will like to pursue hemorrhoid banding at time of her procedure. I have discussed the risks, alternatives, benefits with regards to but not limited to the risk of reaction to medication, bleeding, infection, perforation and the patient is agreeable to proceed. Written consent to be obtained.

## 2016-02-26 NOTE — Progress Notes (Signed)
Primary Care Physician:  Allwardt, Yetta Flock, PA  Primary Gastroenterologist:  Barney Drain, MD   Chief Complaint  Patient presents with  . Rectal Bleeding  . Hemorrhoids    HPI:  Dawn Barton is a 50 y.o. female here At the request of her PCP for further evaluation of hemorrhoids and rectal bleeding. Patient has never had a colonoscopy. She states she has had digestive issues all of her adult life. She has had more issues with her hemorrhoids bleeding ever since she started training as a distant runner several years ago. She states she has internal and external hemorrhoids. No prior colonoscopy. She has digestive concerns with regards to bloating, gas with everything that she eats. More lately she has had pretty regular bowel movements but typically tends towards constipation. She's having 3-4 stools daily, all formed. Denies hard stools. No melena. She limits her dairy products but states it really doesn't seem to matter if she drinks dairy or not. Denies family history of celiac disease, IBD. States that her daughter also has digestive issues as well. Denies heartburn, dysphagia, vomiting, unintentional weight loss. For her hemorrhoids she generally uses Preparation H.    Current Outpatient Prescriptions  Medication Sig Dispense Refill  . acetaminophen (TYLENOL) 500 MG tablet Take 1,000 mg by mouth every 4 (four) hours as needed for moderate pain.    . citalopram (CELEXA) 10 MG tablet Take 10 mg by mouth daily.    . COLLAGEN PO Take 3 tablets by mouth daily.    Marland Kitchen ibuprofen (ADVIL,MOTRIN) 200 MG tablet Take 400 mg by mouth every 4 (four) hours as needed for moderate pain.    Marland Kitchen levothyroxine (SYNTHROID, LEVOTHROID) 100 MCG tablet Take 1 tablet (100 mcg total) by mouth daily before breakfast. 30 tablet 3  . tamoxifen (NOLVADEX) 20 MG tablet Take 1 tablet (20 mg total) by mouth daily. 90 tablet PRN   No current facility-administered medications for this visit.    Allergies as of 02/26/2016   . (No Known Allergies)    Past Medical History  Diagnosis Date  . Complication of anesthesia     slow to wake up  . 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 Radiation 11/12/2011-12/26/2011    5040 cGy  . Hypothyroidism     Past Surgical History  Procedure Laterality Date  . Tubal ligation    . Breast surgery  2003    breast lift  . Breast lumpectomy  10/02/2011    Procedure: LUMPECTOMY;  Surgeon: Marcello Moores A. Cornett, MD;  Location: [facility];  Service: General;  Laterality: Right;  right breast lumpectomy  . Lumbar disc surgery  08/2012    L4-5 microdiscectomy     Family History  Problem Relation Age of Onset  . COPD Mother   . Atrial fibrillation Mother   . Cancer Father     NSCLC  . Sleep apnea Brother   . Colon cancer Neg Hx   . Celiac disease Neg Hx   . Inflammatory bowel disease Neg Hx     Social History   Social History  . Marital Status: Married    Spouse Name: N/A  . Number of Children: 2  . 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 Smoking status: Former Research scientist (life sciences)  . Smokeless tobacco: Never Used  . Alcohol Use: Yes     Comment: social  drinker  . Drug Use: No  . Sexual Activity: Yes    Birth Control/ Protection: Post-menopausal, Surgical     Comment: Tubal ligation   Other Topics Concern  . Not on file   Social History Narrative      ROS:  General: Negative for anorexia, weight loss, fever, chills, fatigue, weakness. Eyes: Negative for vision changes.  ENT: Negative for hoarseness, difficulty swallowing , nasal congestion. CV: Negative for chest pain, angina, palpitations, dyspnea on exertion, peripheral edema.  Respiratory: Negative for dyspnea at rest, dyspnea on exertion, cough, sputum, wheezing.  GI: See history of present illness. GU:  Negative for dysuria, hematuria, urinary incontinence, urinary frequency,  nocturnal urination.  MS: Negative for joint pain, low back pain.  Derm: Negative for rash or itching.  Neuro: Negative for weakness, abnormal sensation, seizure, frequent headaches, memory loss, confusion.  Psych: Negative for anxiety, depression, suicidal ideation, hallucinations.  Endo: Negative for unusual weight change.  Heme: Negative for bruising or bleeding. Allergy: Negative for rash or hives.    Physical Examination:  BP 133/65 mmHg  Pulse 66  Temp(Src) 98.3 F (36.8 C)  Ht 5\' 6"  (1.676 m)  Wt 177 lb 3.2 oz (80.377 kg)  BMI 28.61 kg/m2   General: Well-nourished, well-developed in no acute distress.  Head: Normocephalic, atraumatic.   Eyes: Conjunctiva pink, no icterus. Mouth: Oropharyngeal mucosa moist and pink , no lesions erythema or exudate. Neck: Supple without thyromegaly, masses, or lymphadenopathy.  Lungs: Clear to auscultation bilaterally.  Heart: Regular rate and rhythm, no murmurs rubs or gallops.  Abdomen: Bowel sounds are normal, nontender, nondistended, no hepatosplenomegaly or masses, no abdominal bruits or    hernia , no rebound or guarding.   Rectal: deferred Extremities: No lower extremity edema. No clubbing or deformities.  Neuro: Alert and oriented x 4 , grossly normal neurologically.  Skin: Warm and dry, no rash or jaundice.   Psych: Alert and cooperative, normal mood and affect.  Labs: Lab Results  Component Value Date   WBC 5.5 07/24/2015   HGB 12.9 07/24/2015   HCT 37.7 07/24/2015   MCV 90.2 07/24/2015   PLT 167 07/24/2015   Lab Results  Component Value Date   CREATININE 0.8 07/24/2015   BUN 10.4 07/24/2015   NA 141 07/24/2015   K 4.0 07/24/2015   CL 105 12/09/2014   CO2 28 07/24/2015   Lab Results  Component Value Date   ALT 23 07/24/2015   AST 22 07/24/2015   ALKPHOS 43 07/24/2015   BILITOT 0.56 07/24/2015     Imaging Studies: No results found.

## 2016-02-26 NOTE — Progress Notes (Signed)
CC'ED TO PCP 

## 2016-02-26 NOTE — Patient Instructions (Addendum)
1. Try to limit foods on the FODMAP diet sheet. 2. Add probiotics for at least 4 weeks. You may by over-the-counter Align, [place], [place] are good options.  3. Recommend screening for celiac disease. Please have labs done. 4. Colonoscopy with hemorrhoid banding with Dr. Oneida Alar. See separate instructions.

## 2016-03-06 ENCOUNTER — Telehealth: Payer: Self-pay | Admitting: Gastroenterology

## 2016-03-06 NOTE — Telephone Encounter (Signed)
Pt has been rescheduled to 05/26. No changes noted to medical history. New instructions mailed

## 2016-03-06 NOTE — Telephone Encounter (Signed)
REVIEWED-NO ADDITIONAL RECOMMENDATIONS. 

## 2016-03-06 NOTE — Telephone Encounter (Signed)
Pt called to reschedule her procedure for Monday with SF. Please call her at [phone]

## 2016-03-25 MED FILL — TAMOXIFEN 20 MG TABLET: 20 | 30 days supply | Qty: 30 | Fill #5

## 2016-03-28 ENCOUNTER — Encounter (HOSPITAL_COMMUNITY): Admission: RE | Payer: Self-pay | Source: Ambulatory Visit

## 2016-03-28 ENCOUNTER — Ambulatory Visit (HOSPITAL_COMMUNITY): Admission: RE | Admit: 2016-03-28 | Payer: 59 | Source: Ambulatory Visit | Admitting: Gastroenterology

## 2016-03-28 SURGERY — COLONOSCOPY
Anesthesia: Moderate Sedation

## 2016-04-24 MED FILL — TAMOXIFEN 20 MG TABLET: 20 | 30 days supply | Qty: 30 | Fill #6

## 2016-04-29 DIAGNOSIS — F330 Major depressive disorder, recurrent, mild: Secondary | ICD-10-CM | POA: Diagnosis not present

## 2016-04-29 DIAGNOSIS — E039 Hypothyroidism, unspecified: Secondary | ICD-10-CM | POA: Diagnosis not present

## 2016-04-29 DIAGNOSIS — Z1389 Encounter for screening for other disorder: Secondary | ICD-10-CM | POA: Diagnosis not present

## 2016-05-22 MED FILL — TAMOXIFEN 20 MG TABLET: 20 | 30 days supply | Qty: 30 | Fill #7

## 2016-06-23 MED FILL — TAMOXIFEN 20 MG TABLET: 20 | 30 days supply | Qty: 30 | Fill #8

## 2016-07-09 DIAGNOSIS — H8111 Benign paroxysmal vertigo, right ear: Secondary | ICD-10-CM | POA: Diagnosis not present

## 2016-07-09 DIAGNOSIS — E039 Hypothyroidism, unspecified: Secondary | ICD-10-CM | POA: Diagnosis not present

## 2016-07-09 DIAGNOSIS — Z6829 Body mass index (BMI) 29.0-29.9, adult: Secondary | ICD-10-CM | POA: Diagnosis not present

## 2016-07-09 DIAGNOSIS — R5383 Other fatigue: Secondary | ICD-10-CM | POA: Diagnosis not present

## 2016-07-23 ENCOUNTER — Other Ambulatory Visit: Payer: Self-pay | Admitting: *Deleted

## 2016-07-23 DIAGNOSIS — D0510 Intraductal carcinoma in situ of unspecified breast: Secondary | ICD-10-CM

## 2016-07-24 ENCOUNTER — Telehealth: Payer: Self-pay | Admitting: Oncology

## 2016-07-24 ENCOUNTER — Other Ambulatory Visit: Payer: Self-pay | Admitting: Oncology

## 2016-07-24 ENCOUNTER — Other Ambulatory Visit: Payer: 59

## 2016-07-24 ENCOUNTER — Ambulatory Visit: Payer: 59 | Admitting: Oncology

## 2016-07-24 MED FILL — TAMOXIFEN 20 MG TABLET: 20 | 30 days supply | Qty: 30 | Fill #0

## 2016-07-24 NOTE — Telephone Encounter (Signed)
Chart reviewed.

## 2016-07-24 NOTE — Telephone Encounter (Signed)
Returned call to patient in regards to rescheduling 09/21 appointments. Left a message.

## 2016-08-25 MED FILL — TAMOXIFEN 20 MG TABLET: 20 | 30 days supply | Qty: 30 | Fill #1

## 2016-08-28 ENCOUNTER — Telehealth: Payer: Self-pay

## 2016-08-28 NOTE — Telephone Encounter (Signed)
Pt is calling to reschedule her TCS. Does she need to come back into the office or just update everything over the phone. Please advise. Her call back number is [phone]

## 2016-08-28 NOTE — Telephone Encounter (Signed)
Pt has RSC[employer] TCS x2. SHE NEEDS OPV PRIOR TO RESCHEDULING.

## 2016-09-01 ENCOUNTER — Encounter: Payer: Self-pay | Admitting: Gastroenterology

## 2016-09-01 NOTE — Telephone Encounter (Signed)
Please make her an appointment

## 2016-09-01 NOTE — Telephone Encounter (Signed)
APPT MADE AND LETTER SENT  °

## 2016-09-12 ENCOUNTER — Encounter: Payer: Self-pay | Admitting: Gastroenterology

## 2016-09-12 ENCOUNTER — Ambulatory Visit (INDEPENDENT_AMBULATORY_CARE_PROVIDER_SITE_OTHER): Payer: 59 | Admitting: Gastroenterology

## 2016-09-12 VITALS — BP 126/68 | HR 67 | Temp 98.7°F | Ht 66.0 in | Wt 176.0 lb

## 2016-09-12 DIAGNOSIS — Z1211 Encounter for screening for malignant neoplasm of colon: Secondary | ICD-10-CM | POA: Diagnosis not present

## 2016-09-12 DIAGNOSIS — K649 Unspecified hemorrhoids: Secondary | ICD-10-CM | POA: Diagnosis not present

## 2016-09-12 DIAGNOSIS — K625 Hemorrhage of anus and rectum: Secondary | ICD-10-CM

## 2016-09-12 NOTE — Progress Notes (Addendum)
REVIEWED-NO ADDITIONAL RECOMMENDATIONS.  Primary Care Physician:  Allwardt, Alyssa, PA  Primary Gastroenterologist:    Chief Complaint  Patient presents with  . Hemorrhoids    Hemorrhoid bleeding for decades  . Colonoscopy    reschedule    HPI:  Dawn Barton is a 50 y.o. female here to reschedule her colonoscopy with hemorrhoid banding. This will be patient's first colonoscopy. She states she's had hemorrhoids ever since the birth of her children. She has been having intermittent rectal bleeding for decades. She started training for distant running several years ago and has had more issues with her hemorrhoids since then. Generally has 3-4 stools daily. All stools are formed. No melena. Denies heartburn, dysphagia, vomiting, unintentional weight loss. Uses Preparation H for hemorrhoids. Generally no significant abdominal pain. Occasionally has bloating and gas related to certain foods especially raw vegetables. Does okay if she limits intake.  Current Outpatient Prescriptions  Medication Sig Dispense Refill  . acidophilus (RISAQUAD) CAPS capsule Take 1 capsule by mouth as needed.     . citalopram (CELEXA) 10 MG tablet Take 10 mg by mouth daily.    . COLLAGEN PO Take 1 tablet by mouth 3 (three) times a week.     Marland Kitchen ibuprofen (ADVIL,MOTRIN) 200 MG tablet Take 400 mg by mouth every 4 (four) hours as needed for moderate pain.    Marland Kitchen levothyroxine (SYNTHROID, LEVOTHROID) 100 MCG tablet Take 1 tablet (100 mcg total) by mouth daily before breakfast. 30 tablet 3  . MAGNESIUM PO Take 1 tablet by mouth 3 (three) times a week.    . naproxen sodium (ANAPROX) 220 MG tablet Take 220 mg by mouth daily as needed (pain).    . tamoxifen (NOLVADEX) 20 MG tablet TAKE 1 TABLET BY MOUTH DAILY. 90 tablet 3   No current facility-administered medications for this visit.     Allergies as of 09/12/2016  . (No Known Allergies)    Past Medical History:  Diagnosis Date  . Breast cancer ([place]) 10/02/11   right  low grade ductal carcinoma in situ,er/prpositive  . Complication of anesthesia    slow to wake up  . Hypothyroidism   . Radiation 11/12/2011-12/26/2011   5040 cGy    Past Surgical History:  Procedure Laterality Date  . BREAST LUMPECTOMY  10/02/2011   Procedure: LUMPECTOMY;  Surgeon: Joyice Faster. Cornett, MD;  Location: [facility];  Service: General;  Laterality: Right;  right breast lumpectomy  . BREAST SURGERY  2003   breast lift  . LUMBAR DISC SURGERY  08/2012   L4-5 microdiscectomy   . TUBAL LIGATION      Family History  Problem Relation Age of Onset  . COPD Mother   . Atrial fibrillation Mother   . Cancer Father     NSCLC  . Sleep apnea Brother   . Colon cancer Neg Hx   . Celiac disease Neg Hx   . Inflammatory bowel disease Neg Hx     Social History   Social History  . Marital status: Married    Spouse name: N/A  . Number of children: 2  . 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 Smokeless tobacco: Never Used  . Alcohol use Yes     Comment: social drinker  . Drug use: No  . Sexual activity: Yes    Birth control/ protection: Post-menopausal, Surgical  Comment: Tubal ligation   Other Topics Concern  . Not on file   Social History Narrative  . No narrative on file      ROS:  General: Negative for anorexia, weight loss, fever, chills, fatigue, weakness. Eyes: Negative for vision changes.  ENT: Negative for hoarseness, difficulty swallowing , nasal congestion. CV: Negative for chest pain, angina, palpitations, dyspnea on exertion, peripheral edema.  Respiratory: Negative for dyspnea at rest, dyspnea on exertion, cough, sputum, wheezing.  GI: See history of present illness. GU:  Negative for dysuria, hematuria, urinary incontinence, urinary frequency, nocturnal urination.  MS: Negative for joint pain, low back pain.  Derm: Negative for rash or  itching.  Neuro: Negative for weakness, abnormal sensation, seizure, frequent headaches, memory loss, confusion.  Psych: Negative for anxiety, depression, suicidal ideation, hallucinations.  Endo: Negative for unusual weight change.  Heme: Negative for bruising or bleeding. Allergy: Negative for rash or hives.    Physical Examination:  BP 126/68   Pulse 67   Temp 98.7 F (37.1 C) (Oral)   Ht 5\' 6"  (1.676 m)   Wt 176 lb (79.8 kg)   BMI 28.41 kg/m    General: Well-nourished, well-developed in no acute distress.  Head: Normocephalic, atraumatic.   Eyes: Conjunctiva pink, no icterus. Mouth: Oropharyngeal mucosa moist and pink , no lesions erythema or exudate. Neck: Supple without thyromegaly, masses, or lymphadenopathy.  Lungs: Clear to auscultation bilaterally.  Heart: Regular rate and rhythm, no murmurs rubs or gallops.  Abdomen: Bowel sounds are normal, nontender, nondistended, no hepatosplenomegaly or masses, no abdominal bruits or    hernia , no rebound or guarding.   Rectal: Deferred Extremities: No lower extremity edema. No clubbing or deformities.  Neuro: Alert and oriented x 4 , grossly normal neurologically.  Skin: Warm and dry, no rash or jaundice.   Psych: Alert and cooperative, normal mood and affect.  Labs: Lab Results  Component Value Date   WBC 5.5 07/24/2015   HGB 12.9 07/24/2015   HCT 37.7 07/24/2015   MCV 90.2 07/24/2015   PLT 167 07/24/2015   Lab Results  Component Value Date   CREATININE 0.8 07/24/2015   BUN 10.4 07/24/2015   NA 141 07/24/2015   K 4.0 07/24/2015   CL 105 12/09/2014   CO2 28 07/24/2015   Lab Results  Component Value Date   ALT 23 07/24/2015   AST 22 07/24/2015   ALKPHOS 43 07/24/2015   BILITOT 0.56 07/24/2015     Imaging Studies: No results found.

## 2016-09-12 NOTE — Assessment & Plan Note (Addendum)
50 year old female who presents to schedule her first ever screening colonoscopy. She has had to reschedule couple times due to changes in her schedule. She has a history of hemorrhoids since the birth of her children. She describes decades of intermittent rectal bleeding. Increased issues with her hemorrhoids/bleeding since training for long distance running of the past couple of years. Over-the-counter hemorrhoid creams are not helpful. She would like to pursue hemorrhoid banding at the time of her screening colonoscopy.  Discussed pros and cons of hemorrhoid banding at time of colonoscopy versus in office banding. Patient would like to pursue hemorrhoid banding at time of her procedure. I have discussed the risks, alternatives, benefits with regards to but not limited to the risk of reaction to medication, bleeding, infection, perforation and the patient is agreeable to proceed. Written consent to be obtained.

## 2016-09-12 NOTE — Patient Instructions (Signed)
1. We will plan on colonoscopy with hemorrhoid banding in the near future. We will call you first of the week to schedule. FYI, first available spots are first week of December.

## 2016-09-16 NOTE — Progress Notes (Signed)
cc'ed to pcp °

## 2016-09-17 ENCOUNTER — Other Ambulatory Visit: Payer: Self-pay

## 2016-09-17 DIAGNOSIS — K625 Hemorrhage of anus and rectum: Secondary | ICD-10-CM

## 2016-09-17 MED ORDER — PEG 3350-KCL-NA BICARB-NACL 420 G PO SOLR: 4000.0000 mL | ORAL | 0 refills | Status: DC

## 2016-09-17 NOTE — Progress Notes (Signed)
Please let patient know that coding specialist Alisia Ferrari) states her colonoscopy will be diagnostic not a screening because of the rectal bleeding which is ongoing process even though suspected hemorrhoids.   If she is ready, please schedule colonoscopy with hemorrhoid banding with Dr. Oneida Alar.

## 2016-09-17 NOTE — Progress Notes (Signed)
Pt is aware that the TCS will not be a screening. She is set up for 10/14/16 @ 2:00 pm. She will need updated information before her TCS.

## 2016-09-18 NOTE — Progress Notes (Signed)
Noted. Will call and update prior to procedure.

## 2016-09-22 MED FILL — GAVILYTE-N SOLUTION: 420 | 1 days supply | Qty: 4000 | Fill #0

## 2016-09-22 MED FILL — TAMOXIFEN 20 MG TABLET: 20 | 30 days supply | Qty: 30 | Fill #2

## 2016-10-09 ENCOUNTER — Telehealth: Payer: Self-pay | Admitting: *Deleted

## 2016-10-09 NOTE — Telephone Encounter (Signed)
Pt called stating that she has not been able to get in touch with scheduling.  Scheduled and confirmed pt for 11/04/16.

## 2016-10-13 ENCOUNTER — Telehealth: Payer: Self-pay

## 2016-10-13 NOTE — Telephone Encounter (Signed)
I called pt and she has not had any change in her meds since she was scheduled for the colonoscopy.

## 2016-10-13 NOTE — Telephone Encounter (Signed)
Noted. Scheduled as planned.

## 2016-10-14 ENCOUNTER — Encounter (HOSPITAL_COMMUNITY): Payer: Self-pay | Admitting: *Deleted

## 2016-10-14 ENCOUNTER — Ambulatory Visit (HOSPITAL_COMMUNITY)
Admission: RE | Admit: 2016-10-14 | Discharge: 2016-10-14 | Disposition: A | Discharge status: Home / Self Care | Payer: 59 | Source: Ambulatory Visit | Attending: Gastroenterology | Admitting: Gastroenterology

## 2016-10-14 ENCOUNTER — Encounter (HOSPITAL_COMMUNITY)
Admission: RE | Disposition: A | Discharge status: Home / Self Care | Payer: Self-pay | Source: Ambulatory Visit | Attending: Gastroenterology

## 2016-10-14 DIAGNOSIS — E039 Hypothyroidism, unspecified: Secondary | ICD-10-CM | POA: Diagnosis not present

## 2016-10-14 DIAGNOSIS — Z853 Personal history of malignant neoplasm of breast: Secondary | ICD-10-CM | POA: Diagnosis not present

## 2016-10-14 DIAGNOSIS — Z923 Personal history of irradiation: Secondary | ICD-10-CM | POA: Insufficient documentation

## 2016-10-14 DIAGNOSIS — Z87891 Personal history of nicotine dependence: Secondary | ICD-10-CM | POA: Insufficient documentation

## 2016-10-14 DIAGNOSIS — K644 Residual hemorrhoidal skin tags: Secondary | ICD-10-CM | POA: Diagnosis not present

## 2016-10-14 DIAGNOSIS — Z809 Family history of malignant neoplasm, unspecified: Secondary | ICD-10-CM | POA: Diagnosis not present

## 2016-10-14 DIAGNOSIS — Z825 Family history of asthma and other chronic lower respiratory diseases: Secondary | ICD-10-CM | POA: Insufficient documentation

## 2016-10-14 DIAGNOSIS — Q438 Other specified congenital malformations of intestine: Secondary | ICD-10-CM | POA: Insufficient documentation

## 2016-10-14 DIAGNOSIS — K921 Melena: Secondary | ICD-10-CM | POA: Diagnosis not present

## 2016-10-14 DIAGNOSIS — K648 Other hemorrhoids: Secondary | ICD-10-CM | POA: Diagnosis not present

## 2016-10-14 DIAGNOSIS — K625 Hemorrhage of anus and rectum: Secondary | ICD-10-CM

## 2016-10-14 HISTORY — PX: HEMORRHOID BANDING: SHX5850

## 2016-10-14 HISTORY — PX: COLONOSCOPY: SHX5424

## 2016-10-14 SURGERY — COLONOSCOPY
Anesthesia: Moderate Sedation

## 2016-10-14 MED ORDER — MIDAZOLAM HCL 5 MG/5ML IJ SOLN
INTRAMUSCULAR | Status: AC
  Filled 2016-10-14: qty 10

## 2016-10-14 MED ORDER — SIMETHICONE 40 MG/0.6ML PO SUSP
ORAL | Status: DC | PRN
  Administered 2016-10-14: 100 mL

## 2016-10-14 MED ORDER — MEPERIDINE HCL 100 MG/ML IJ SOLN
INTRAMUSCULAR | Status: AC
  Filled 2016-10-14: qty 2

## 2016-10-14 MED ORDER — MEPERIDINE HCL 100 MG/ML IJ SOLN
INTRAMUSCULAR | Status: DC | PRN
  Administered 2016-10-14 (×2): 50 mg via INTRAVENOUS

## 2016-10-14 MED ORDER — MIDAZOLAM HCL 5 MG/5ML IJ SOLN
INTRAMUSCULAR | Status: DC | PRN
  Administered 2016-10-14 (×2): 2 mg via INTRAVENOUS

## 2016-10-14 MED ORDER — SODIUM CHLORIDE 0.9 % IV SOLN
INTRAVENOUS | Status: DC
  Administered 2016-10-14: 14:00:00 via INTRAVENOUS

## 2016-10-14 NOTE — Discharge Instructions (Signed)
You have internal hemorrhoids. YOU DID NOT HAVE ANY POLYPS. I PLACED 3 BANDS TO TREAT YOUR HEMORRHOIDAL BLEEDING. YOU MAY SOME MILD BLEEDING OVER THE NEXT 3 TO 5 DAYS.   PLEASE CALL [phone] IF YOU HAVE A FEVER, A LARGE AMOUNT OF BLEEDING, OR DIFFICULTY URINATING.  DRINK WATER TO KEEP URINE LIGHT YELLOW.  YOU MAY USE NAPROXEN TWICE DAILY IF NEEDED FOR RECTAL DISCOMFORT. TYLENOL AS NEED FOR ADDITIONAL PAIN RELIEF.  IF NEEDED, USE COLACE, OR SENNAKOT ONE TO THREE TIMES DAILY TO SOFTEN STOOL.  FOLLOW A HIGH FIBER DIET. AVOID ITEMS THAT CAUSE BLOATING & GAS. SEE INFO BELOW.  Next colonoscopy in 10 years.    Colonoscopy Care After Read the instructions outlined below and refer to this sheet in the next week. These discharge instructions provide you with general information on caring for yourself after you leave the hospital. While your treatment has been planned according to the most current medical practices available, unavoidable complications occasionally occur. If you have any problems or questions after discharge, call DR. Areli Jowett, [phone].  ACTIVITY  You may resume your regular activity, but move at a slower pace for the next 24 hours.   Take frequent rest periods for the next 24 hours.   Walking will help get rid of the air and reduce the bloated feeling in your belly (abdomen).   No driving for 24 hours (because of the medicine (anesthesia) used during the test).   You may shower.   Do not sign any important legal documents or operate any machinery for 24 hours (because of the anesthesia used during the test).    NUTRITION  Drink plenty of fluids.   You may resume your normal diet as instructed by your doctor.   Begin with a light meal and progress to your normal diet. Heavy or fried foods are harder to digest and may make you feel sick to your stomach (nauseated).   Avoid alcoholic beverages for 24 hours or as instructed.    MEDICATIONS  You may resume  your normal medications.   WHAT YOU CAN EXPECT TODAY  Some feelings of bloating in the abdomen.   Passage of more gas than usual.   Spotting of blood in your stool or on the toilet paper  .  IF YOU HAD POLYPS REMOVED DURING THE COLONOSCOPY:  Eat a soft diet IF YOU HAVE NAUSEA, BLOATING, ABDOMINAL PAIN, OR VOMITING.    FINDING OUT THE RESULTS OF YOUR TEST Not all test results are available during your visit. DR. Oneida Alar WILL CALL YOU WITHIN 14 DAYS OF YOUR PROCEDUE WITH YOUR RESULTS. Do not assume everything is normal if you have not heard from DR. Kayela Humphres, CALL HER OFFICE AT [phone].  SEEK IMMEDIATE MEDICAL ATTENTION AND CALL THE OFFICE: [phone] IF:  You have more than a spotting of blood in your stool.   Your belly is swollen (abdominal distention).   You are nauseated or vomiting.   You have a temperature over 101F.   You have abdominal pain or discomfort that is severe or gets worse throughout the day.   Hemorrhoids Hemorrhoids are dilated (enlarged) veins around the rectum. Sometimes clots will form in the veins. This makes them swollen and painful. These are called thrombosed hemorrhoids. Causes of hemorrhoids include:  Constipation.   Straining to have a bowel movement.   HEAVY LIFTING  HOME CARE INSTRUCTIONS  Eat a well balanced diet and drink 6 to 8 glasses of water every day to avoid constipation. You may  also use a bulk laxative.   Avoid straining to have bowel movements.   Keep anal area dry and clean.   Do not use a donut shaped pillow or sit on the toilet for long periods. This increases blood pooling and pain.   Move your bowels when your body has the urge; this will require less straining and will decrease pain and pressure.  HEMORRHOIDAL BANDING COMPLICATIONS:  COMMON: 1. MINOR PAIN  UNCOMMON: 1. ABSCESS 2. BAND FALLS OFF 3. PROLAPSE OF HEMORRHOIDS AND PAIN 4. ULCER BLEEDING  A. USUALLY SELF-LIMITED: MAY LAST 3-5 DAYS  B. MAY  REQUIRE INTERVENTION: 1-2 WEEKS AFTER INTERACTIONS 5. NECROTIZING PELVIC SEPSIS  A. SYMPTOMS: FEVER, PAIN, DIFFICULTY URINATING  High-Fiber Diet A high-fiber diet changes your normal diet to include more whole grains, legumes, fruits, and vegetables. Changes in the diet involve replacing refined carbohydrates with unrefined foods. The calorie level of the diet is essentially unchanged. The Dietary Reference Intake (recommended amount) for adult males is 38 grams per day. For adult females, it is 25 grams per day. Pregnant and lactating women should consume 28 grams of fiber per day. Fiber is the intact part of a plant that is not broken down during digestion. Functional fiber is fiber that has been isolated from the plant to provide a beneficial effect in the body. PURPOSE  Increase stool bulk.   Ease and regulate bowel movements.   Lower cholesterol.   REDUCE RISK OF COLON CANCER  INDICATIONS THAT YOU NEED MORE FIBER  Constipation and hemorrhoids.   Uncomplicated diverticulosis (intestine condition) and irritable bowel syndrome.   Weight management.   As a protective measure against hardening of the arteries (atherosclerosis), diabetes, and cancer.  INCLUDE A VARIETY  GUIDELINES FOR INCREASING FIBER IN THE DIET  Start adding fiber to the diet slowly. A gradual increase of about 5 more grams (2 slices of whole-wheat bread, 2 servings of most fruits or vegetables, or 1 bowl of high-fiber cereal) per day is best. Too rapid an increase in fiber may result in constipation, flatulence, and bloating.   Drink enough water and fluids to keep your urine clear or pale yellow. Water, juice, or caffeine-free drinks are recommended. Not drinking enough fluid may cause constipation.   Eat a variety of high-fiber foods rather than one type of fiber.   Try to increase your intake of fiber through using high-fiber foods rather than fiber pills or supplements that contain small amounts of fiber.    The goal is to change the types of food eaten. Do not supplement your present diet with high-fiber foods, but replace foods in your present diet.   SOURCES OF FIBER   Replace refined and processed grains with whole grains, canned fruits with fresh fruits, and incorporate other fiber sources. White rice, white breads, and most bakery goods contain little or no fiber.   Brown whole-grain rice, buckwheat oats, and many fruits and vegetables are all good sources of fiber. These include: broccoli, Brussels sprouts, cabbage, cauliflower, beets, sweet potatoes, white potatoes (skin on), carrots, tomatoes, eggplant, squash, berries, fresh fruits, and dried fruits.   Cereals appear to be the richest source of fiber. Cereal fiber is found in whole grains and bran. Bran is the fiber-rich outer coat of cereal grain, which is largely removed in refining. In whole-grain cereals, the bran remains. In breakfast cereals, the largest amount of fiber is found in those with "bran" in their names. The fiber content is sometimes indicated on the label.  You may need to include additional fruits and vegetables each day.   In baking, for 1 cup white flour, you may use the following substitutions:   1 cup whole-wheat flour minus 2 tablespoons.   1/2 cup white flour plus 1/2 cup whole-wheat flour.

## 2016-10-14 NOTE — H&P (Signed)
Primary Care Physician:  Allwardt, Alyssa, PA Primary Gastroenterologist:  Dr. Oneida Alar  Pre-Procedure History & Physical: HPI:  Dawn Barton is a 50 y.o. female here for  RECTAL BLEEDING.  Past Medical History:  Diagnosis Date  . 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 Complication of anesthesia    slow to wake up  . Hypothyroidism   . Radiation 11/12/2011-12/26/2011   5040 cGy    Past Surgical History:  Procedure Laterality Date  . BREAST LUMPECTOMY  10/02/2011   Procedure: LUMPECTOMY;  Surgeon: Joyice Faster. Cornett, MD;  Location: [place];  Service: General;  Laterality: Right;  right breast lumpectomy  . BREAST SURGERY  2003   breast lift  . LUMBAR DISC SURGERY  08/2012   L4-5 microdiscectomy   . TUBAL LIGATION      Prior to Admission medications   Medication Sig Start Date End Date Taking? Authorizing Provider  acidophilus (RISAQUAD) CAPS capsule Take 1 capsule by mouth once a week.    Yes Historical Provider, MD  citalopram (CELEXA) 10 MG tablet Take 10 mg by mouth at bedtime.    Yes Historical Provider, MD  COLLAGEN PO Take 1 tablet by mouth 3 (three) times a week.    Yes Historical Provider, MD  ibuprofen (ADVIL,MOTRIN) 200 MG tablet Take 400 mg by mouth every 4 (four) hours as needed for moderate pain.   Yes Historical Provider, MD  levothyroxine (SYNTHROID, LEVOTHROID) 100 MCG tablet Take 1 tablet (100 mcg total) by mouth daily before breakfast. 09/24/15  Yes Cassandria Anger, MD  MAGNESIUM PO Take 1 tablet by mouth 3 (three) times a week.   Yes Historical Provider, MD  naproxen sodium (ANAPROX) 220 MG tablet Take 220 mg by mouth daily as needed (pain).   Yes Historical Provider, MD  polyethylene glycol-electrolytes (TRILYTE) 420 g solution Take 4,000 mLs by mouth as directed. 09/17/16  Yes Danie Binder, MD  tamoxifen (NOLVADEX) 20 MG tablet TAKE 1 TABLET BY MOUTH DAILY. Patient taking differently:  TAKE 1 TABLET BY MOUTH DAILY AT NIGHT 07/24/16  Yes Chauncey Cruel, MD    Allergies as of 09/17/2016  . (No Known Allergies)    Family History  Problem Relation Age of Onset  . COPD Mother   . Atrial fibrillation Mother   . Cancer Father     NSCLC  . Sleep apnea Brother   . Colon cancer Neg Hx   . Celiac disease Neg Hx   . Inflammatory bowel disease Neg Hx     Social History   Social History  . Marital status: Married    Spouse name: N/A  . Number of children: 2  . 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 Smoking status: Former Research scientist (life sciences)  . Smokeless tobacco: Never Used  . Alcohol use Yes     Comment: social drinker  . Drug use: No  . Sexual activity: Yes    Birth control/ protection: Post-menopausal, Surgical     Comment: Tubal ligation   Other Topics Concern  . Not on file   Social History Narrative  . No narrative on file    Review of Systems: See HPI, otherwise negative ROS   Physical Exam: BP (!) 111/57   Pulse 66   Temp 99 F (37.2 C) (Oral)   Resp 15   Ht 5\' 6"  (1.676  m)   Wt 174 lb (78.9 kg)   SpO2 99%   BMI 28.08 kg/m  General:   Alert,  pleasant and cooperative in NAD Head:  Normocephalic and atraumatic. Neck:  Supple; Lungs:  Clear throughout to auscultation.    Heart:  Regular rate and rhythm. Abdomen:  Soft, nontender and nondistended. Normal bowel sounds, without guarding, and without rebound.   Neurologic:  Alert and  oriented x4;  grossly normal neurologically.  Impression/Plan:     RECTAL BLEEDING  PLAN:  1. TCS/? Hemorrhoid banding TODAY. DISCUSSED PROCEDURE, BENEFITS, & RISKS: < 1% chance of medication reaction, bleeding, perforation, PELVIC VEIN SEPSIS, or rupture of spleen/liver.

## 2016-10-14 NOTE — Op Note (Signed)
[facility] Patient Name: Dawn Barton Procedure Date: 10/14/2016 1:53 PM MRN: [id number] Date of Birth: [birth year] Attending MD: Barney Drain , MD CSN: [id number] Age: 50 Admit Type: Outpatient Procedure:                Colonoscopy WITH HEMORRHOID BANDING Indications:              Hematochezia Providers:                Barney Drain, MD, Rosina Lowenstein, RN, Isabella Stalling,                            Technician Referring MD:             Theresa Duty Medicines:                Meperidine 100 mg IV, Midazolam 4 mg IV Complications:            No immediate complications. Estimated Blood Loss:     Estimated blood loss was minimal. Procedure:                Pre-Anesthesia Assessment:                           - Prior to the procedure, a History and Physical                            was performed, and patient medications and                            allergies were reviewed. The patient's tolerance of                            previous anesthesia was also reviewed. The risks                            and benefits of the procedure and the sedation                            options and risks were discussed with the patient.                            All questions were answered, and informed consent                            was obtained. Prior Anticoagulants: The patient has                            taken ibuprofen. ASA Grade Assessment: II - A                            patient with mild systemic disease. After reviewing                            the risks and benefits, the patient was deemed in  satisfactory condition to undergo the procedure.                            After obtaining informed consent, the colonoscope                            was passed under direct vision. Throughout the                            procedure, the patient's blood pressure, pulse, and                            oxygen saturations were monitored continuously. The                             [id number] scope was introduced through                            the anus and advanced to the 10 cm into the ileum.                            The [id number] scope was introduced through                            the and advanced to the. The terminal ileum,                            ileocecal valve, appendiceal orifice, and rectum                            were photographed. The colonoscopy was somewhat                            difficult due to a tortuous colon. Successful                            completion of the procedure was aided by increasing                            the dose of sedation medication and COLOWRAP. The                            patient tolerated the procedure fairly well. The                            quality of the bowel preparation was excellent. Scope In: 2:22:51 PM Scope Out: 2:45:19 PM Scope Withdrawal Time: 0 hours 17 minutes 40 seconds  Total Procedure Duration: 0 hours 22 minutes 28 seconds  Findings:      The digital rectal exam findings include non-thrombosed external       hemorrhoids.      The terminal ileum appeared normal.      The sigmoid colon and descending colon were moderately redundant.      Internal hemorrhoids were  found. The hemorrhoids were large. Three bands       were successfully placed. A slow ooze remained at the end of the       procedure. Impression:               - Non-thrombosed external hemorrhoids found on                            digital rectal exam.                           - The examined portion of the ileum was normal.                           - Redundant colon.                           - Internal hemorrhoids. Banded x3. Moderate Sedation:      Moderate (conscious) sedation was administered by the endoscopy nurse       and supervised by the endoscopist. The following parameters were       monitored: oxygen saturation, heart rate, blood pressure, and response        to care. Total physician intraservice time was 36 minutes. Recommendation:           - High fiber diet.                           - Continue present medications.                           - Repeat colonoscopy in 10 years for surveillance.                           - Return to GI office in 4 months.                           - Patient has a contact number available for                            emergencies. The signs and symptoms of potential                            delayed complications were discussed with the                            patient. Return to normal activities tomorrow.                            Written discharge instructions were provided to the                            patient. Procedure Code(s):        --- Professional ---                           [phone], Colonoscopy, flexible; with band ligation(s)                            (  eg, hemorrhoids)                           99152, Moderate sedation services provided by the                            same physician or other qualified health care                            professional performing the diagnostic or                            therapeutic service that the sedation supports,                            requiring the presence of an independent trained                            observer to assist in the monitoring of the                            patient's level of consciousness and physiological                            status; initial 15 minutes of intraservice time,                            patient age 24 years or older                           [id number], Moderate sedation services; each additional                            15 minutes intraservice time Diagnosis Code(s):        --- Professional ---                           K64.4, Residual hemorrhoidal skin tags                           K64.8, Other hemorrhoids                           K92.1, Melena (includes Hematochezia)                            Q43.8, Other specified congenital malformations of                            intestine CPT copyright 2016 American Medical Association. All rights reserved. The codes documented in this report are preliminary and upon coder review may  be revised to meet current compliance requirements. Barney Drain, MD Barney Drain, MD 10/14/2016 3:07:01 PM This report has been signed electronically. Number of Addenda: 0

## 2016-10-16 ENCOUNTER — Encounter (HOSPITAL_COMMUNITY): Payer: Self-pay | Admitting: Gastroenterology

## 2016-10-20 MED FILL — TAMOXIFEN 20 MG TABLET: 20 | 30 days supply | Qty: 30 | Fill #3

## 2016-10-22 ENCOUNTER — Telehealth: Payer: Self-pay

## 2016-10-22 NOTE — Telephone Encounter (Signed)
Pt called to say she is still having pressure and rectal bleeding after having colonoscopy and hemorrhoid banding a week ago.  She said she expected it for several days, but she is still having it.   The pressure gets bad at times and she sits on a hard barstool and it helps.  She has some rectal bleeding on tissue, but a couple of times has seen quite a bit of bright red blood in the commode.  She is having regular Bm's with no problem.  Please advise!

## 2016-10-23 NOTE — Telephone Encounter (Signed)
Noted  

## 2016-10-23 NOTE — Telephone Encounter (Signed)
Called patient TO DISCUSS CONCERNS. STILL FEELING PRESSURE OFF AND ON. RAN YESTERDAY FOR FIRST TIME: ~2 MILES. PRESSURE COMES AND GOES. THOUGHT MAYBE ONE OF THE BANDS FELL OFF. NO FEVER AND CAN URINATE. IF SHE SITS ON A HARD BAR STOOL THE PRESSURE GOES AWAY. BLEEDING IS BETTER BUT NOT RESOLVED. NOT EVERY TIME. TAKING STOOL SOFTENER. SEEING HEAVY BLEEDING: 1-2X/DAY, AND THEN THIS AM A LITTLE BLEEDING. HAS PRESSURE MAKING HER FEEL LIKE SHE HAS TO GO. EXPECT TO SEE IMPROVEMENT IN SYMPTOMS IN 2-3 WEEKS. CALL WITH QUESTIONS OR CONCERNS. CONTINUE STOOL SOFTENERS. MOVE BOWELS WHEN SHE HAS THE URGE. IF SYMPTOMS DON'T RESOLVE AFTER 23- WEEKS. CONSIDER ADDITIONAL FLEX SIG/IH BANDING

## 2016-10-29 ENCOUNTER — Other Ambulatory Visit: Payer: Self-pay | Admitting: *Deleted

## 2016-10-29 DIAGNOSIS — D0510 Intraductal carcinoma in situ of unspecified breast: Secondary | ICD-10-CM

## 2016-10-30 ENCOUNTER — Other Ambulatory Visit: Payer: Self-pay | Admitting: Hematology and Oncology

## 2016-10-30 DIAGNOSIS — D0510 Intraductal carcinoma in situ of unspecified breast: Secondary | ICD-10-CM

## 2016-11-04 ENCOUNTER — Ambulatory Visit (HOSPITAL_BASED_OUTPATIENT_CLINIC_OR_DEPARTMENT_OTHER): Payer: 59 | Admitting: Oncology

## 2016-11-04 ENCOUNTER — Other Ambulatory Visit (HOSPITAL_BASED_OUTPATIENT_CLINIC_OR_DEPARTMENT_OTHER): Payer: 59

## 2016-11-04 VITALS — BP 118/46 | HR 61 | Temp 98.4°F | Resp 18 | Ht 66.0 in | Wt 181.3 lb

## 2016-11-04 DIAGNOSIS — D0511 Intraductal carcinoma in situ of right breast: Secondary | ICD-10-CM

## 2016-11-04 DIAGNOSIS — D0510 Intraductal carcinoma in situ of unspecified breast: Secondary | ICD-10-CM

## 2016-11-04 LAB — COMPREHENSIVE METABOLIC PANEL
ALT: 18 U/L (ref 0–55)
AST: 15 U/L (ref 5–34)
Albumin: 3.9 g/dL (ref 3.5–5.0)
Alkaline Phosphatase: 40 U/L (ref 40–150)
Anion Gap: 8 mEq/L (ref 3–11)
BUN: 8.7 mg/dL (ref 7.0–26.0)
CHLORIDE: 106 meq/L (ref 98–109)
CO2: 27 mEq/L (ref 22–29)
Calcium: 9.2 mg/dL (ref 8.4–10.4)
Creatinine: 0.8 mg/dL (ref 0.6–1.1)
EGFR: 89 mL/min/{1.73_m2} — ABNORMAL LOW (ref >90)
GLUCOSE: 87 mg/dL (ref 70–140)
POTASSIUM: 4 meq/L (ref 3.5–5.1)
SODIUM: 141 meq/L (ref 136–145)
Total Bilirubin: 0.54 mg/dL (ref 0.20–1.20)
Total Protein: 7 g/dL (ref 6.4–8.3)

## 2016-11-04 LAB — CBC WITH DIFFERENTIAL/PLATELET
BASO%: 0.6 % (ref 0.0–2.0)
BASOS ABS: 0 10*3/uL (ref 0.0–0.1)
EOS%: 2.6 % (ref 0.0–7.0)
Eosinophils Absolute: 0.1 10*3/uL (ref 0.0–0.5)
HCT: 37.4 % (ref 34.8–46.6)
HEMOGLOBIN: 12.6 g/dL (ref 11.6–15.9)
LYMPH%: 33.6 % (ref 14.0–49.7)
MCH: 30.4 pg (ref 25.1–34.0)
MCHC: 33.7 g/dL (ref 31.5–36.0)
MCV: 90.1 fL (ref 79.5–101.0)
MONO#: 0.5 10*3/uL (ref 0.1–0.9)
MONO%: 10.8 % (ref 0.0–14.0)
NEUT#: 2.6 10*3/uL (ref 1.5–6.5)
NEUT%: 52.4 % (ref 38.4–76.8)
Platelets: 209 10*3/uL (ref 145–400)
RBC: 4.15 10*6/uL (ref 3.70–5.45)
RDW: 14.2 % (ref 11.2–14.5)
WBC: 5 10*3/uL (ref 3.9–10.3)
lymph#: 1.7 10*3/uL (ref 0.9–3.3)

## 2016-11-04 NOTE — Progress Notes (Signed)
[place]  [phone] [phone]  OFFICE PROGRESS NOTE    ID: Dawn Barton   DOB: November 28, 1965  MR#: [id number]  EQA#:[id number]   PCP: Theresa Duty, PA GYN: Elmarie Mainland, M.D. SU: Erroll Luna, M.D. RAD ONC: Gery Pray, M.D. SU: Erline Levine, M.D.  CHIEF COMPLAINT: right breast cancer CURRENT TREATMENT: Completing 5 years of tamoxifen in March 2018  BREAST CANCER HISTORY: From Dr. Collier Salina Rubin's 10/20/2011 note:  "Presents with an abnormal mammogram. She has undergone annual mammography. This was initially performed in [place]. A cluster of right breast calcifications were seen in the lower outer quadrant of the right breast. 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 The patient underwent lumpectomy on 10/02/2011. This showed low-grade DCIS, grade 1, with clear surgical margins. There were a few foci of in situ carcinoma ranging from 0.1 2.3 cm in and around the previous biopsy cavity. The patient had an unremarkable postoperative course."   The patient's subsequent history is as detailed below.  INTERVAL HISTORY:  Dawn Barton returns for follow up of her right breast ductal carcinoma in situ. She is doing well on tamoxifen, currently with no vaginal discharge problems. She started Celexa at low dose and that took care of the hot flashes. She obtains a drug at a very good price.  REVIEW OF SYSTEMS: She rarely has shooting pains associated with the surgery in the right breast. She understands these are benign. Dawn Barton runs or goes to the gym about 4 days every week. Of course she is working full-time and enjoying it. Family is doing well. The detailed review of systems today was otherwise noncontributory  PAST MEDICAL HISTORY: Past Medical History:  Diagnosis Date  . Breast cancer ([place]) 10/02/11   right low grade ductal carcinoma in  situ,er/prpositive  . Complication of anesthesia    slow to wake up  . Hypothyroidism   . Radiation 11/12/2011-12/26/2011   5040 cGy    PAST SURGICAL HISTORY: Past Surgical History:  Procedure Laterality Date  . BREAST LUMPECTOMY  10/02/2011   Procedure: LUMPECTOMY;  Surgeon: Joyice Faster. Cornett, MD;  Location: [place];  Service: General;  Laterality: Right;  right breast lumpectomy  . BREAST SURGERY  2003   breast lift  . COLONOSCOPY N/A 10/14/2016   Procedure: COLONOSCOPY;  Surgeon: Danie Binder, MD;  [facility];  Service: Endoscopy;  Laterality: N/A;  200  . HEMORRHOID BANDING N/A 10/14/2016   Procedure: HEMORRHOID BANDING;  Surgeon: Danie Binder, MD;  [facility];  Service: Endoscopy;  Laterality: N/A;  . LUMBAR [place] SURGERY  08/2012   L4-5 microdiscectomy   . TUBAL LIGATION       FAMILY HISTORY Family History  Problem Relation Age of Onset  . COPD Mother   . Atrial fibrillation Mother   . Cancer Father     NSCLC  . Sleep apnea Brother   . Colon cancer Neg Hx   . Celiac disease Neg Hx   . Inflammatory bowel disease Neg Hx   The patient's father died from small cell lung cancer at the age of 6. The patient's mother is living, in her mid 31s. The patient has one brother, no sisters. There is no history of breast or ovarian cancer in the family.   GYNECOLOGIC HISTORY: Menarche age 47, first live birth age 28. She is GX P2. Last menstrual  cycle was in 01/2011. She never took hormone replacement but she did take birth control pills for approximately 15 years with no complications.   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  Her husband Elta Guadeloupe works in Press photographer for and [employer]. Daughter Albina Billet is a Air cabin crew in [place]. Daughter Beulah Gandy is studying audiology. The patient has 1 grandchild. She attends a [employer].  ADVANCED  DIRECTIVES: Not in place  HEALTH MAINTENANCE: Social History  Substance Use Topics  . Smoking status: Former Research scientist (life sciences)  . Smokeless tobacco: Never Used  . Alcohol use Yes     Comment: social drinker     Colonoscopy: N/A  PAP: N/A  Bone density: N/A  Lipid panel: N/A  No Known Allergies  Current Outpatient Prescriptions  Medication Sig Dispense Refill  . acidophilus (RISAQUAD) CAPS capsule Take 1 capsule by mouth once a week.     . citalopram (CELEXA) 10 MG tablet Take 10 mg by mouth at bedtime.     . COLLAGEN PO Take 1 tablet by mouth 3 (three) times a week.     Marland Kitchen ibuprofen (ADVIL,MOTRIN) 200 MG tablet Take 400 mg by mouth every 4 (four) hours as needed for moderate pain.    Marland Kitchen levothyroxine (SYNTHROID, LEVOTHROID) 100 MCG tablet Take 1 tablet (100 mcg total) by mouth daily before breakfast. 30 tablet 3  . MAGNESIUM PO Take 1 tablet by mouth 3 (three) times a week.    . naproxen sodium (ANAPROX) 220 MG tablet Take 220 mg by mouth daily as needed (pain).     No current facility-administered medications for this visit.     OBJECTIVE: Middle-aged white woman In no acute distress Vitals:   11/04/16 0930  BP: (!) 118/46  Pulse: 61  Resp: 18  Temp: 98.4 F (36.9 C)     Body mass index is 29.26 kg/m.    ECOG FS: 0  Sclerae unicteric, pupils round and equal Oropharynx clear and moist-- no thrush or other lesions No cervical or supraclavicular adenopathy Lungs no rales or rhonchi Heart regular rate and rhythm Abd soft, nontender, positive bowel sounds MSK no focal spinal tenderness, no upper extremity lymphedema Neuro: nonfocal, well oriented, appropriate affect Breasts: The right breast is status post lumpectomy and radiation. There are some irregularities superiorly which are not changed from prior. There is no evidence of local recurrence. The right axilla is benign. The left breast is unremarkable.   LAB RESULTS: Lab Results  Component Value Date   WBC 5.0 11/04/2016    NEUTROABS 2.6 11/04/2016   HGB 12.6 11/04/2016   HCT 37.4 11/04/2016   MCV 90.1 11/04/2016   PLT 209 11/04/2016      Chemistry      Component Value Date/Time   NA 141 11/04/2016 0917   K 4.0 11/04/2016 0917   CL 105 12/09/2014 0855   CL 103 12/08/2012 0928   CO2 27 11/04/2016 0917   BUN 8.7 11/04/2016 0917   CREATININE 0.8 11/04/2016 0917      Component Value Date/Time   CALCIUM 9.2 11/04/2016 0917   ALKPHOS 40 11/04/2016 0917   AST 15 11/04/2016 0917   ALT 18 11/04/2016 0917   BILITOT 0.54 11/04/2016 0917      No results found for: LABCA2  No components found for: [id number]  No results for input(s): INR in the last 168 hours.  Urinalysis    Component Value  Date/Time   COLORURINE YELLOW 12/09/2014 0900    STUDIES: Repeat mammography pending  ASSESSMENT: 51 y.o. BRCA negative, [place], [place] woman status post right lumpectomy 10/02/2011 for a low-grade ductal carcinoma in situ measuring less than 5 mm, estrogen receptor positive and 100%, progesterone receptor positive at 4%.  (1) Participated in NSABP B-43 study and received Trastuzumab x 2, last dose February 2013  (2) Completed adjuvant radiation 12/26/2011  (3) Started Tamoxifen in 01/2012, completing 5 years March 2018  PLAN: Mao now 5 years out from definitive surgery for her noninvasive breast cancer, with no evidence of disease recurrence. This is very favorable.  She will complete 5 years of tamoxifen in March 2018. She understands that for ductal carcinoma in situ we do not have data for continuing tamoxifen beyond 5 years.  Accordingly I am comfortable releasing her to her primary care physician's care. All she will need is yearly mammography, preferably with tomography (3D) and a yearly physician breast exam.  We discussed our survivorship program, but she declines to participate.  I have not made her a routine return appointment with me here, but I will be glad to see her again at any point  in the future if on when the need arises.   Paxten Appelt C   11/04/2016 10:15 AM

## 2016-11-17 ENCOUNTER — Other Ambulatory Visit (HOSPITAL_COMMUNITY): Payer: Self-pay | Admitting: Hematology & Oncology

## 2016-11-17 DIAGNOSIS — Z9889 Other specified postprocedural states: Secondary | ICD-10-CM

## 2016-11-25 ENCOUNTER — Other Ambulatory Visit: Payer: Self-pay | Admitting: Oncology

## 2016-11-25 ENCOUNTER — Ambulatory Visit (HOSPITAL_COMMUNITY)
Admission: RE | Admit: 2016-11-25 | Discharge: 2016-11-25 | Disposition: A | Discharge status: Home / Self Care | Payer: 59 | Source: Ambulatory Visit | Attending: Hematology and Oncology | Admitting: Hematology and Oncology

## 2016-11-25 DIAGNOSIS — D0510 Intraductal carcinoma in situ of unspecified breast: Secondary | ICD-10-CM | POA: Diagnosis not present

## 2016-11-25 DIAGNOSIS — R928 Other abnormal and inconclusive findings on diagnostic imaging of breast: Secondary | ICD-10-CM | POA: Diagnosis not present

## 2016-11-25 DIAGNOSIS — Z853 Personal history of malignant neoplasm of breast: Secondary | ICD-10-CM | POA: Insufficient documentation

## 2016-11-26 MED FILL — TAMOXIFEN 20 MG TABLET: 20 | 30 days supply | Qty: 30 | Fill #4

## 2016-11-28 DIAGNOSIS — K219 Gastro-esophageal reflux disease without esophagitis: Secondary | ICD-10-CM | POA: Diagnosis not present

## 2016-12-18 DIAGNOSIS — F330 Major depressive disorder, recurrent, mild: Secondary | ICD-10-CM | POA: Diagnosis not present

## 2016-12-18 DIAGNOSIS — E039 Hypothyroidism, unspecified: Secondary | ICD-10-CM | POA: Diagnosis not present

## 2016-12-18 DIAGNOSIS — Z6829 Body mass index (BMI) 29.0-29.9, adult: Secondary | ICD-10-CM | POA: Diagnosis not present

## 2017-01-13 MED FILL — TAMOXIFEN 20 MG TABLET: 20 | 30 days supply | Qty: 30 | Fill #5

## 2017-01-30 DIAGNOSIS — J111 Influenza due to unidentified influenza virus with other respiratory manifestations: Secondary | ICD-10-CM | POA: Diagnosis not present

## 2017-02-09 MED FILL — valACYclovir HCL 1 GM TABS: 1 | 6 days supply | Qty: 12 | Fill #0

## 2017-02-17 MED FILL — TAMOXIFEN 20 MG TABLET: 20 | 30 days supply | Qty: 30 | Fill #6

## 2017-03-23 MED FILL — TAMOXIFEN 20 MG TABLET: 20 | 30 days supply | Qty: 30 | Fill #7

## 2017-05-27 DIAGNOSIS — H524 Presbyopia: Secondary | ICD-10-CM | POA: Diagnosis not present

## 2017-06-03 MED FILL — TAMOXIFEN 20 MG TABLET: 20 | 30 days supply | Qty: 30 | Fill #8

## 2017-09-08 DIAGNOSIS — F330 Major depressive disorder, recurrent, mild: Secondary | ICD-10-CM | POA: Diagnosis not present

## 2017-09-08 DIAGNOSIS — Z6828 Body mass index (BMI) 28.0-28.9, adult: Secondary | ICD-10-CM | POA: Diagnosis not present

## 2017-09-08 DIAGNOSIS — E039 Hypothyroidism, unspecified: Secondary | ICD-10-CM | POA: Diagnosis not present

## 2017-09-08 DIAGNOSIS — R5383 Other fatigue: Secondary | ICD-10-CM | POA: Diagnosis not present

## 2017-09-21 MED FILL — DENAVIR 1% CREAM: 1 | 4 days supply | Qty: 5 | Fill #0

## 2017-11-10 ENCOUNTER — Telehealth: Payer: Self-pay | Admitting: Oncology

## 2017-11-10 ENCOUNTER — Other Ambulatory Visit: Payer: Self-pay | Admitting: *Deleted

## 2017-11-10 NOTE — Telephone Encounter (Signed)
11/10/17 - Called patient on the phone.  Patient stated she is doing good.  No problems.  She is going to schedule her mammogram and MD visit for the end of January. Remer Macho 11/10/17

## 2017-12-01 ENCOUNTER — Telehealth: Payer: Self-pay | Admitting: *Deleted

## 2017-12-01 NOTE — Telephone Encounter (Signed)
This RN per pt's prior call regarding issue of screening vs diagnostic mammogram - with insurance only covering a screening - per their policy that post 5 years per history of breast cancer diagnostic not covered-with out of pocket cost to patient of over $300.  Per radiology protocol - diagnostic is required for 7 years from diagnosis.  Post multiple discussions by this RN with different departments - given resource per the " pink ribbon fund " that could cover the difference in screening vs diagnostic ( per radiology at [facility] ).  The above request was discussed with Forest[facility] Na - who are unaware of resource so this RN was transferred to the director of [place] - message left on her VM per above with request to return call to this RN for appropriate follow up.  This RN attempted to contact pt per above - obtained voice mail - message left requesting return call to this RN to discuss current status.  Plan await call from outreach services and patient for further follow up.

## 2018-01-21 ENCOUNTER — Other Ambulatory Visit: Payer: Self-pay | Admitting: Oncology

## 2018-04-05 DIAGNOSIS — E782 Mixed hyperlipidemia: Secondary | ICD-10-CM | POA: Diagnosis not present

## 2018-04-05 DIAGNOSIS — Z0000 Encounter for general adult medical examination without abnormal findings: Secondary | ICD-10-CM | POA: Diagnosis not present

## 2018-04-05 DIAGNOSIS — Z6827 Body mass index (BMI) 27.0-27.9, adult: Secondary | ICD-10-CM | POA: Diagnosis not present

## 2018-04-05 DIAGNOSIS — E039 Hypothyroidism, unspecified: Secondary | ICD-10-CM | POA: Diagnosis not present

## 2018-04-05 DIAGNOSIS — F330 Major depressive disorder, recurrent, mild: Secondary | ICD-10-CM | POA: Diagnosis not present

## 2018-10-18 ENCOUNTER — Other Ambulatory Visit: Payer: Self-pay | Admitting: Oncology

## 2018-10-18 DIAGNOSIS — Z1231 Encounter for screening mammogram for malignant neoplasm of breast: Secondary | ICD-10-CM

## 2018-11-11 ENCOUNTER — Ambulatory Visit (HOSPITAL_COMMUNITY)
Admission: RE | Admit: 2018-11-11 | Discharge: 2018-11-11 | Disposition: A | Discharge status: Home / Self Care | Payer: 59 | Source: Ambulatory Visit | Attending: Oncology | Admitting: Oncology

## 2018-11-11 ENCOUNTER — Encounter (HOSPITAL_COMMUNITY): Payer: Self-pay

## 2018-11-11 DIAGNOSIS — Z1231 Encounter for screening mammogram for malignant neoplasm of breast: Secondary | ICD-10-CM | POA: Insufficient documentation

## 2019-01-14 DIAGNOSIS — E782 Mixed hyperlipidemia: Secondary | ICD-10-CM | POA: Diagnosis not present

## 2019-01-14 DIAGNOSIS — E039 Hypothyroidism, unspecified: Secondary | ICD-10-CM | POA: Diagnosis not present

## 2019-01-14 DIAGNOSIS — Z0000 Encounter for general adult medical examination without abnormal findings: Secondary | ICD-10-CM | POA: Diagnosis not present

## 2019-03-08 DIAGNOSIS — E039 Hypothyroidism, unspecified: Secondary | ICD-10-CM | POA: Diagnosis not present

## 2019-12-27 ENCOUNTER — Other Ambulatory Visit (HOSPITAL_COMMUNITY): Payer: Self-pay | Admitting: Physician Assistant

## 2019-12-27 DIAGNOSIS — Z1231 Encounter for screening mammogram for malignant neoplasm of breast: Secondary | ICD-10-CM

## 2020-01-04 ENCOUNTER — Ambulatory Visit (HOSPITAL_COMMUNITY)
Admission: RE | Admit: 2020-01-04 | Discharge: 2020-01-04 | Disposition: A | Discharge status: Home / Self Care | Payer: 59 | Source: Ambulatory Visit | Attending: Physician Assistant | Admitting: Physician Assistant

## 2020-01-04 ENCOUNTER — Other Ambulatory Visit: Payer: Self-pay

## 2020-01-04 DIAGNOSIS — Z1231 Encounter for screening mammogram for malignant neoplasm of breast: Secondary | ICD-10-CM | POA: Diagnosis not present

## 2020-01-05 ENCOUNTER — Ambulatory Visit (HOSPITAL_COMMUNITY): Payer: 59

## 2020-03-07 DIAGNOSIS — H8111 Benign paroxysmal vertigo, right ear: Secondary | ICD-10-CM | POA: Diagnosis not present

## 2020-03-07 DIAGNOSIS — E782 Mixed hyperlipidemia: Secondary | ICD-10-CM | POA: Diagnosis not present

## 2020-03-07 DIAGNOSIS — E039 Hypothyroidism, unspecified: Secondary | ICD-10-CM | POA: Diagnosis not present

## 2020-03-07 DIAGNOSIS — R5383 Other fatigue: Secondary | ICD-10-CM | POA: Diagnosis not present

## 2020-03-07 DIAGNOSIS — Z6828 Body mass index (BMI) 28.0-28.9, adult: Secondary | ICD-10-CM | POA: Diagnosis not present

## 2020-03-07 DIAGNOSIS — H524 Presbyopia: Secondary | ICD-10-CM | POA: Diagnosis not present

## 2020-03-07 DIAGNOSIS — F330 Major depressive disorder, recurrent, mild: Secondary | ICD-10-CM | POA: Diagnosis not present

## 2020-03-07 LAB — BASIC METABOLIC PANEL
BUN: 17 (ref 4–21)
Creatinine: 0.7 (ref 0.5–1.1)
Glucose: 79

## 2020-03-07 LAB — TSH: TSH: 3.51 (ref 0.41–5.90)

## 2020-03-07 LAB — LIPID PANEL
Cholesterol: 222 — AB (ref 0–200)
HDL: 75 — AB (ref 35–70)
LDL Cholesterol: 130
Triglycerides: 95 (ref 40–160)

## 2020-06-07 NOTE — Progress Notes (Signed)
Primary Care Physician:  Allwardt, Yetta Flock, PA Primary Gastroenterologist:  Dr. Oneida Alar; pending Dr. Abbey Chatters  Chief Complaint  Patient presents with  . Hemorrhoids    c/o bleeding, constant pressure. Bleeding is increasing. Denies constipation, denies straining    HPI:   Dawn Barton is a 54 y.o. female presenting today for hemorrhoids.  She has history of intermittent rectal bleeding in the setting of hemorrhoids.  Colonoscopy in December 2017 with redundant colon, nonthrombosed external hemorrhoids, large internal hemorrhoids s/p banding x3.  Recommended repeat colonoscopy in 10 years.  Patient called 1 week after colonoscopy reporting she is still having pressure and rectal bleeding.  She was advised that symptoms should improve over the next 2 to 3 weeks and to call if they did not resolve, would consider additional flex sig/IH banding.  Today: Rectal bleeding resolved after TCS in 2017 for about 6 months. Rectal bleeding was every now and then. If she ran long distance, she would have rectal bleeding. Associated rectal pressure.  Frequency of rectal bleeding as well as amount of rectal bleeding is increasing.  Has been the worst over the last 2 weeks.  States toilet water will be completely red.  Symptoms are occurring daily.  No significant rectal pain, itching, or burning.  Primarily pressure.  She is using Preparation H and witch hazel.  Typically applies at nighttime.  Reports having good BMs daily. No diarrhea. Some incomplete BMs. Sits on the toilet for a long time due to the pressure in her rectum making her feel she needs to have a BM. Doesn't think she is straining. No fiber supplement. No black stools. No abdominal pain. No unintentional weight loss.   Rare use of ibuprofen or Naproxen.    Symptoms are essentially the same as they were prior to her colonoscopy with hemorrhoid banding in 2017.  Discussed the possibility of repeat colonoscopy to ensure that rectal bleeding is  only from hemorrhoids.  Patient prefers to hold off and to try hemorrhoid banding first.  No rectal exam today.  No other significant GI symptoms.  Past Medical History:  Diagnosis Date  . 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 Complication of anesthesia    slow to wake up  . Hypothyroidism   . Radiation 11/12/2011-12/26/2011   5040 cGy    Past Surgical History:  Procedure Laterality Date  . BREAST LUMPECTOMY  10/02/2011   Procedure: LUMPECTOMY;  Surgeon: Joyice Faster. Cornett, MD;  Location: [facility];  Service: General;  Laterality: Right;  right breast lumpectomy  . BREAST SURGERY  2003   breast lift  . COLONOSCOPY N/A 10/14/2016   Procedure: COLONOSCOPY;  Surgeon: Danie Binder, MD;  redundant colon, nonthrombosed external hemorrhoids, large internal hemorrhoids s/p banding x3.  Recommended repeat colonoscopy in 10 years.   Marland Kitchen HEMORRHOID BANDING N/A 10/14/2016   Procedure: HEMORRHOID BANDING;  Surgeon: Danie Binder, MD;  [facility];  Service: Endoscopy;  Laterality: N/A;  . LUMBAR [place] SURGERY  08/2012   L4-5 microdiscectomy   . TUBAL LIGATION      Current Outpatient Medications  Medication Sig Dispense Refill  . citalopram (CELEXA) 10 MG tablet Take 10 mg by mouth at bedtime.     . COLLAGEN PO Take 1 tablet by mouth 3 (three) times a week.     Marland Kitchen ibuprofen (ADVIL,MOTRIN) 200 MG tablet Take 400 mg by mouth every 4 (four) hours as needed for moderate  pain.    . levothyroxine (SYNTHROID, LEVOTHROID) 100 MCG tablet Take 1 tablet (100 mcg total) by mouth daily before breakfast. (Patient taking differently: Take 112 mcg by mouth daily before breakfast. ) 30 tablet 3  . naproxen sodium (ANAPROX) 220 MG tablet Take 220 mg by mouth daily as needed (pain).    Clelia Croft Butter (PREPARATION H RE) Place rectally as needed.    Marland Kitchen acidophilus (RISAQUAD) CAPS capsule Take 1 capsule by mouth once a week.   (Patient not taking: Reported on 06/08/2020)     No current facility-administered medications for this visit.    Allergies as of 06/08/2020  . (No Known Allergies)    Family History  Problem Relation Age of Onset  . COPD Mother   . Atrial fibrillation Mother   . Cancer Father        NSCLC  . Sleep apnea Brother   . Colon cancer Neg Hx   . Celiac disease Neg Hx   . Inflammatory bowel disease Neg Hx     Social History   Socioeconomic History  . Marital status: Married    Spouse name: Not on file  . Number of children: 2  . Years of education: Not on file  . Highest education level: Not on file  Occupational History  . 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 Smoking status: Former Research scientist (life sciences)  . Smokeless tobacco: Never Used  Substance and Sexual Activity  . Alcohol use: Yes    Comment: social drinker  . Drug use: No  . Sexual activity: Yes    Birth control/protection: Post-menopausal, Surgical    Comment: Tubal ligation  Other Topics Concern  . Not on file  Social History Narrative  . Not on file   Social Determinants of Health   Financial Resource Strain:   . Difficulty of Paying Living Expenses:   Food Insecurity:   . Worried About Charity fundraiser in the Last Year:   . Arboriculturist in the Last Year:   Transportation Needs:   . Film/video editor (Medical):   Marland Kitchen Lack of Transportation (Non-Medical):   Physical Activity:   . Days of Exercise per Week:   . Minutes of Exercise per Session:   Stress:   . Feeling of Stress :   Social Connections:   . Frequency of Communication with Friends and Family:   . Frequency of Social Gatherings with Friends and Family:   . Attends Religious Services:   . Active Member of Clubs or Organizations:   . Attends Archivist Meetings:   Marland Kitchen Marital Status:   Intimate Partner Violence:   . Fear of Current or Ex-Partner:   . Emotionally Abused:   Marland Kitchen Physically Abused:   . Sexually  Abused:     Review of Systems: Gen: Denies any fever, chills, cold or flulike symptoms, lightheadedness, dizziness, presyncope, syncope. CV: Denies chest pain or heart palpitations. Resp: Denies shortness of breath or cough. GI: See HPI Heme: See HPI  Physical Exam: BP 117/69   Pulse 65   Temp (!) 97 F (36.1 C)   Ht 5\' 5"  (1.651 m)   Wt 179 lb 9.6 oz (81.5 kg)   BMI 29.89 kg/m  General:   Alert and oriented. Pleasant and cooperative. Well-nourished and well-developed.  Head:  Normocephalic and atraumatic. Eyes:  Without icterus, sclera clear and conjunctiva pink.  Ears:  Normal auditory acuity. Lungs:  Clear to  auscultation bilaterally. No wheezes, rales, or rhonchi. No distress.  Heart:  S1, S2 present without murmurs appreciated.  Abdomen:  +BS, soft, non-tender and non-distended. No HSM noted. No guarding or rebound. No masses appreciated.  Rectal:  Deferred  Msk:  Symmetrical without gross deformities. Normal posture. Extremities:  Without edema. Neurologic:  Alert and  oriented x4;  grossly normal neurologically. Skin:  Intact without significant lesions or rashes. Psych: Normal mood and affect.

## 2020-06-08 ENCOUNTER — Encounter: Payer: Self-pay | Admitting: Gastroenterology

## 2020-06-08 ENCOUNTER — Other Ambulatory Visit: Payer: Self-pay

## 2020-06-08 ENCOUNTER — Ambulatory Visit (INDEPENDENT_AMBULATORY_CARE_PROVIDER_SITE_OTHER): Payer: 59 | Admitting: Gastroenterology

## 2020-06-08 VITALS — BP 117/69 | HR 65 | Temp 97.0°F | Ht 65.0 in | Wt 179.6 lb

## 2020-06-08 DIAGNOSIS — K625 Hemorrhage of anus and rectum: Secondary | ICD-10-CM

## 2020-06-08 DIAGNOSIS — K649 Unspecified hemorrhoids: Secondary | ICD-10-CM

## 2020-06-08 NOTE — Assessment & Plan Note (Signed)
Addressed under rectal bleeding 

## 2020-06-08 NOTE — Assessment & Plan Note (Addendum)
54 year old female with history of rectal bleeding in the setting of hemorrhoids s/p banding at the time of colonoscopy in 2017.  Colonoscopy revealed nonthrombosed external hemorrhoids, large internal hemorrhoids s/p banding x3 with recommendations to repeat colonoscopy in 10 years.  Patient reports rectal bleeding resolved only for about 6 months after colonoscopy and returned thereafter.  Symptoms have been worsening.  Currently with rectal bleeding turning toilet water red daily x2 weeks.  Associated rectal pressure but no significant rectal pain, burning, or itching.  Denies constipation but she does have incomplete bowel movements and sits on the toilet for a long time due to the pressure in her rectum. Using Preparation H and witch hazel at night.  Denies abdominal pain, melena, unintentional weight loss, lightheadedness, or dizziness.  States symptoms are the same as they were prior to hemorrhoid banding in 2017.  Suspect rectal bleeding is secondary to known hemorrhoids.  Cannot rule out other sources including colon polyps or malignancy, but this is less likely.  Discussed the possibility of repeating colonoscopy and patient prefers to hold off on this.  She desires to have repeat hemorrhoid banding.  Explained that if rectal bleeding continued without improvement with rectal creams or hemorrhoid banding, she would need a colonoscopy.  She stated her understanding.  Plan: Update CBC Start [facility] Hemorrhoid Cream compounded with lidocaine twice daily per rectum x10-14 days then as needed. Add Benefiber or Metamucil daily. Limit toilet time to 2-3 minutes. Avoid straining. Follow-up in 4-6 weeks with Roseanne Kaufman, NP for consideration of hemorrhoid banding.

## 2020-06-08 NOTE — Patient Instructions (Addendum)
Please have labs completed.   I have called in a compounded hemorrhoid cream to [facility]. They will call you when it is ready to pick up. The medication should be ready in 30 minutes. Please apply this medication twice daily per your rectum for the next 10-14 days then as needed.   Add benefiber or metamucil daily.   Limit toilet time to 2-3 minutes.   Avoid straining.   We will try to get you back to the office in 4-6 weeks for consideration of a hemorrhoid banding.   Aliene Altes, PA-C [facility] Gastroenterology

## 2020-06-19 ENCOUNTER — Encounter: Payer: Self-pay | Admitting: Endocrinology

## 2020-08-07 ENCOUNTER — Ambulatory Visit: Payer: 59 | Admitting: Gastroenterology

## 2020-08-07 ENCOUNTER — Other Ambulatory Visit: Payer: Self-pay

## 2020-08-07 ENCOUNTER — Encounter: Payer: Self-pay | Admitting: Gastroenterology

## 2020-08-07 VITALS — BP 109/65 | HR 65 | Temp 97.3°F | Ht 66.0 in | Wt 179.4 lb

## 2020-08-07 DIAGNOSIS — K641 Second degree hemorrhoids: Secondary | ICD-10-CM

## 2020-08-07 NOTE — Progress Notes (Signed)
   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 Last colonoscopy in 2017 by Dr. Oneida Alar with redundant colon, nonthrombosed external hemorrhoids, large internal hemorrhoids s/p banding x3. She has had recurrence of symptoms including pressure, low-volume hematochezia, prolapsing. Anusol has helped at times. Running worsens.   The patient presents with symptomatic grade 2 hemorrhoids, unresponsive to maximal medical therapy, requesting rubber band ligation of her hemorrhoidal disease. All risks, benefits, and alternative forms of therapy were described and informed consent was obtained.  The decision was made to band the left lateral internal hemorrhoid, and the [place] was used to perform band ligation without complication. Digital anorectal examination was then performed to assure proper positioning of the band, and to adjust the banded tissue as required. The patient was discharged home without pain or other issues. Dietary and behavioral recommendations were given and (if necessary prescriptions were given), along with follow-up instructions. The patient will return in 2-3 weeks for followup and possible additional banding as required.  No complications were encountered and the patient tolerated the procedure well   Annitta Needs, PhD, [facility] Gastroenterology

## 2020-08-07 NOTE — Patient Instructions (Signed)
Continue to avoid straining, limit toilet time to 2-3 minutes.  We will see you in 2-3 weeks for additional banding!  It was a pleasure to see you today. I want to create trusting relationships with patients to provide genuine, compassionate, and quality care. I value your feedback. If you receive a survey regarding your visit,  I greatly appreciate you taking time to fill this out.   Annitta Needs, PhD, ANP-BC [facility] Gastroenterology

## 2020-08-08 ENCOUNTER — Encounter: Payer: Self-pay | Admitting: Gastroenterology

## 2020-08-15 ENCOUNTER — Ambulatory Visit: Payer: 59 | Admitting: Endocrinology

## 2020-08-15 ENCOUNTER — Encounter: Payer: Self-pay | Admitting: Endocrinology

## 2020-08-15 ENCOUNTER — Telehealth: Payer: Self-pay | Admitting: Endocrinology

## 2020-08-15 ENCOUNTER — Other Ambulatory Visit: Payer: Self-pay

## 2020-08-15 VITALS — BP 120/80 | HR 80 | Ht 66.0 in | Wt 180.0 lb

## 2020-08-15 DIAGNOSIS — H8110 Benign paroxysmal vertigo, unspecified ear: Secondary | ICD-10-CM

## 2020-08-15 DIAGNOSIS — E039 Hypothyroidism, unspecified: Secondary | ICD-10-CM

## 2020-08-15 LAB — LIPID PANEL
Cholesterol: 198 mg/dL (ref 0–200)
HDL: 63.5 mg/dL (ref >39.00)
LDL Cholesterol: 118 mg/dL — ABNORMAL HIGH (ref 0–99)
NonHDL: 134.99
Total CHOL/HDL Ratio: 3
Triglycerides: 84 mg/dL (ref 0.0–149.0)
VLDL: 16.8 mg/dL (ref 0.0–40.0)

## 2020-08-15 LAB — T3, FREE: T3, Free: 2.8 pg/mL (ref 2.3–4.2)

## 2020-08-15 LAB — TSH: TSH: 2.4 u[IU]/mL (ref 0.35–4.50)

## 2020-08-15 LAB — T4, FREE: Free T4: 0.91 ng/dL (ref 0.60–1.60)

## 2020-08-15 NOTE — Telephone Encounter (Signed)
Patient came in for appointment and at check in, she asked why she was seeing another Dr because she wanted Dr Kelton Pillar originally. I explained we were not aware of that, it was not in her referral, but I would send a message to Dr Kelton Pillar to see if she could now switch to see her instead.

## 2020-08-15 NOTE — Telephone Encounter (Signed)
OK 

## 2020-08-15 NOTE — Patient Instructions (Signed)
Blood tests are requested for you today.  We'll let you know about the results.  It is best to never miss the medication.  However, if you do miss it, next best is to double up the next time. Please come back for a follow-up appointment in 1 year.  I would be happy to recheck sooner if you want--just let me know.

## 2020-08-15 NOTE — Progress Notes (Signed)
Subjective:    Patient ID: Dawn Barton, female    DOB: 10-04-1966, 54 y.o.   MRN: [id number]  HPI Pt is referred by Theresa Duty, PA,  for hypothyroidism.  Pt reports hypothyroidism was dx'ed in 2014.  she has been on prescribed thyroid hormone therapy since dx.  she has never taken kelp or any other type of non-prescribed thyroid product.  she has never had thyroid imaging.  she has never had thyroid surgery, or XRT to the neck.  she has never been on amiodarone or lithium.  She has been on 112 mcg/d, x 1 year.   Past Medical History:  Diagnosis Date  . 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 Complication of anesthesia    slow to wake up  . Hypothyroidism   . Radiation 11/12/2011-12/26/2011   5040 cGy    Past Surgical History:  Procedure Laterality Date  . BREAST LUMPECTOMY  10/02/2011   Procedure: LUMPECTOMY;  Surgeon: Joyice Faster. Cornett, MD;  Location: [facility];  Service: General;  Laterality: Right;  right breast lumpectomy  . BREAST SURGERY  2003   breast lift  . COLONOSCOPY N/A 10/14/2016   redundant colon, nonthrombosed external hemorrhoids, large internal hemorrhoids s/p banding x3.   Marland Kitchen HEMORRHOID BANDING N/A 10/14/2016   Procedure: HEMORRHOID BANDING;  Surgeon: Danie Binder, MD;  [facility];  Service: Endoscopy;  Laterality: N/A;  . LUMBAR [place] SURGERY  08/2012   L4-5 microdiscectomy   . TUBAL LIGATION      Social History   Socioeconomic History  . Marital status: Married    Spouse name: Not on file  . Number of children: 2  . Years of education: Not on file  . Highest education level: Not on file  Occupational History  . 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 Smoking status: Former Research scientist (life sciences)  . Smokeless tobacco: Never Used  Substance and Sexual Activity  . Alcohol use: Yes    Comment: social drinker  . Drug use: No  . Sexual activity: Yes    Birth  control/protection: Post-menopausal, Surgical    Comment: Tubal ligation  Other Topics Concern  . Not on file  Social History Narrative  . Not on file   Social Determinants of Health   Financial Resource Strain:   . Difficulty of Paying Living Expenses: Not on file  Food Insecurity:   . Worried About Charity fundraiser in the Last Year: Not on file  . Ran Out of Food in the Last Year: Not on file  Transportation Needs:   . Lack of Transportation (Medical): Not on file  . Lack of Transportation (Non-Medical): Not on file  Physical Activity:   . Days of Exercise per Week: Not on file  . Minutes of Exercise per Session: Not on file  Stress:   . Feeling of Stress : Not on file  Social Connections:   . Frequency of Communication with Friends and Family: Not on file  . Frequency of Social Gatherings with Friends and Family: Not on file  . Attends Religious Services: Not on file  . Active Member of Clubs or Organizations: Not on file  . Attends Archivist Meetings: Not on file  . Marital Status: Not on file  Intimate Partner Violence:   . Fear of Current or Ex-Partner: Not on file  . Emotionally Abused: Not on file  .  Physically Abused: Not on file  . Sexually Abused: Not on file    Current Outpatient Medications on File Prior to Visit  Medication Sig Dispense Refill  . citalopram (CELEXA) 10 MG tablet Take 10 mg by mouth at bedtime.     Marland Kitchen ibuprofen (ADVIL,MOTRIN) 200 MG tablet Take 400 mg by mouth every 4 (four) hours as needed for moderate pain.    Marland Kitchen levothyroxine (SYNTHROID) 112 MCG tablet 112 mcg.    . naproxen sodium (ANAPROX) 220 MG tablet Take 220 mg by mouth daily as needed (pain).    Clelia Croft Butter (PREPARATION H RE) Place rectally as needed.    . COLLAGEN PO Take 1 tablet by mouth 3 (three) times a week.      No current facility-administered medications on file prior to visit.    No Known Allergies  Family History  Problem Relation Age of  Onset  . COPD Mother   . Atrial fibrillation Mother   . Cancer Father        NSCLC  . Sleep apnea Brother   . Colon cancer Neg Hx   . Celiac disease Neg Hx   . Inflammatory bowel disease Neg Hx   . Thyroid disease Neg Hx     BP 120/80   Pulse 80   Ht 5\' 6"  (1.676 m)   Wt 180 lb (81.6 kg)   SpO2 98%   BMI 29.05 kg/m     Review of Systems denies depression, muscle cramps, constipation, numbness, and cold intolerance.  She has dry skin.  She has gained a few lbs.     Objective:   Physical Exam VS: see vs page GEN: no distress HEAD: head: no deformity eyes: no periorbital swelling, no proptosis external nose and ears are normal NECK: supple, thyroid is not enlarged CHEST WALL: no deformity LUNGS: clear to auscultation CV: reg rate and rhythm, no murmur.  MUSCULOSKELETAL: muscle bulk and strength are grossly normal.  gait is normal and steady EXTEMITIES: no deformity.  no leg edema, but there are bilat vv's.  NEURO:  cn 2-12 grossly intact.   readily moves all 4's.  sensation is intact to touch on all 4's.  No tremor SKIN:  Normal texture and temperature.  No rash or suspicious lesion is visible.   NODES:  None palpable at the neck PSYCH: alert, well-oriented.  Does not appear anxious nor depressed.     Lab Results  Component Value Date   TSH 3.51 03/07/2020   I have reviewed outside records, and summarized: Pt was noted to have hypothyroidism, and referred here.  Wellness and BPV were also addressed.    Lab Results  Component Value Date   TSH 2.40 08/15/2020      Assessment & Plan:  Hypothyroidism, new to me.  Well-replaced.  Please continue the same levothyroxine  Patient Instructions  Blood tests are requested for you today.  We'll let you know about the results.  It is best to never miss the medication.  However, if you do miss it, next best is to double up the next time. Please come back for a follow-up appointment in 1 year.  I would be happy to recheck  sooner if you want--just let me know.

## 2020-08-18 DIAGNOSIS — H8110 Benign paroxysmal vertigo, unspecified ear: Secondary | ICD-10-CM | POA: Insufficient documentation

## 2020-08-30 ENCOUNTER — Encounter: Payer: Self-pay | Admitting: Gastroenterology

## 2020-08-30 ENCOUNTER — Other Ambulatory Visit: Payer: Self-pay

## 2020-08-30 ENCOUNTER — Ambulatory Visit: Payer: 59 | Admitting: Gastroenterology

## 2020-08-30 ENCOUNTER — Telehealth: Payer: Self-pay | Admitting: Gastroenterology

## 2020-08-30 VITALS — BP 121/58 | HR 55 | Temp 97.8°F | Ht 66.0 in | Wt 178.0 lb

## 2020-08-30 DIAGNOSIS — I8312 Varicose veins of left lower extremity with inflammation: Secondary | ICD-10-CM | POA: Diagnosis not present

## 2020-08-30 DIAGNOSIS — I8311 Varicose veins of right lower extremity with inflammation: Secondary | ICD-10-CM | POA: Diagnosis not present

## 2020-08-30 DIAGNOSIS — K641 Second degree hemorrhoids: Secondary | ICD-10-CM | POA: Diagnosis not present

## 2020-08-30 DIAGNOSIS — I83893 Varicose veins of bilateral lower extremities with other complications: Secondary | ICD-10-CM | POA: Diagnosis not present

## 2020-08-30 DIAGNOSIS — I83813 Varicose veins of bilateral lower extremities with pain: Secondary | ICD-10-CM | POA: Diagnosis not present

## 2020-08-30 NOTE — Patient Instructions (Signed)
Continue to avoid straining, constipation, and limit toilet time as you are doing!  We will see you back in a few weeks to band the final column! You may need an additional neutral banding, but we will see how it goes.  I enjoyed seeing you again today! As you know, I value our relationship and want to provide genuine, compassionate, and quality care. I welcome your feedback. If you receive a survey regarding your visit,  I greatly appreciate you taking time to fill this out. See you next time!  Annitta Needs, PhD, ANP-BC [facility] Gastroenterology

## 2020-08-30 NOTE — Telephone Encounter (Signed)
Can we open an appointment for the patient to have a banding in 2-3 weeks?  I told the patient I will call her to see if she can come on a day we can open an appointment

## 2020-08-30 NOTE — Progress Notes (Signed)
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 Last colonoscopy in 2017 by Dr. Oneida Alar with redundant colon, nonthrombosed external hemorrhoids, large internal hemorrhoids s/p banding x3.She has had recurrence of symptoms including pressure, low-volume hematochezia, prolapsing. Banding of left lateral completed at last appointment. She has noticed some improvement with bleeding. Continues with intermittent pressure, prolapse.   The patient presents with symptomatic grade 2 hemorrhoids, unresponsive to maximal medical therapy, requesting rubber band ligation of her hemorrhoidal disease. All risks, benefits, and alternative forms of therapy were described and informed consent was obtained.  The decision was made to band the right posterior  internal hemorrhoid, and the [place] was used to perform band ligation without complication. Digital anorectal examination was then performed to assure proper positioning of the band, and to adjust the banded tissue as required. The patient was discharged home without pain or other issues. Dietary and behavioral recommendations were given and (if necessary prescriptions were given), along with follow-up instructions. The patient will return in 2-3 weeks for followup and possible additional banding as required.  No complications were encountered and the patient tolerated the procedure well.  Annitta Needs, PhD, ANP-BC [facility] Gastroenterology

## 2020-08-31 ENCOUNTER — Telehealth: Payer: Self-pay | Admitting: Internal Medicine

## 2020-08-31 NOTE — Telephone Encounter (Signed)
Spoke with pt. States no pain , just pressure that woke her up in the middle of the night so she took tylenol  and it eased the pressure. Was curious because she didn't have this reaction last time. Didn't work today because of her discomfort. Please advise.

## 2020-08-31 NOTE — Telephone Encounter (Signed)
[phone] patient called stating that she had a banding a few weeks ago and had no issues, she had one 08/30/20 and she is having pressure that woke her up during the night

## 2020-08-31 NOTE — Telephone Encounter (Signed)
Spoke with patient. No pain. Only pressure. May use tylenol prn. Anusol cream and lidocaine prn may be used. Patient is a Marine scientist and well-versed on application.

## 2020-09-03 NOTE — Telephone Encounter (Signed)
I opened up a 11 am for Dawn Barton on 11/19

## 2020-09-11 DIAGNOSIS — E6609 Other obesity due to excess calories: Secondary | ICD-10-CM | POA: Diagnosis not present

## 2020-09-11 DIAGNOSIS — E785 Hyperlipidemia, unspecified: Secondary | ICD-10-CM | POA: Diagnosis not present

## 2020-09-11 DIAGNOSIS — Z6830 Body mass index (BMI) 30.0-30.9, adult: Secondary | ICD-10-CM | POA: Diagnosis not present

## 2020-09-12 ENCOUNTER — Telehealth: Payer: Self-pay | Admitting: Gastroenterology

## 2020-09-12 NOTE — Telephone Encounter (Signed)
APPOINTMENT CANCELLED AND TIME FROZEN

## 2020-09-12 NOTE — Telephone Encounter (Signed)
Dawn Barton, please cancel appt on 1119 at 11 am and freeze slot (I am not supposed to have a slot that day normally; we had just opened it to accommodate). She wants to postpone banding.

## 2020-09-17 ENCOUNTER — Other Ambulatory Visit (HOSPITAL_COMMUNITY): Payer: Self-pay | Admitting: Family Medicine

## 2020-09-21 ENCOUNTER — Encounter: Payer: 59 | Admitting: Gastroenterology

## 2020-09-24 MED FILL — WEGOVY 0.25 MG/0.5ML SOAJ: 0.25 | 28 days supply | Qty: 2 | Fill #0

## 2020-10-04 DIAGNOSIS — I8311 Varicose veins of right lower extremity with inflammation: Secondary | ICD-10-CM | POA: Diagnosis not present

## 2020-10-04 DIAGNOSIS — I83813 Varicose veins of bilateral lower extremities with pain: Secondary | ICD-10-CM | POA: Diagnosis not present

## 2020-10-04 DIAGNOSIS — I8312 Varicose veins of left lower extremity with inflammation: Secondary | ICD-10-CM | POA: Diagnosis not present

## 2020-10-04 DIAGNOSIS — I83893 Varicose veins of bilateral lower extremities with other complications: Secondary | ICD-10-CM | POA: Diagnosis not present

## 2020-10-15 NOTE — Progress Notes (Deleted)
Cardiology Office Note:    Date:  10/15/2020   ID:  Dawn Barton, DOB 30-Oct-1966, MRN [id number]  PCP:  Theresa Duty, [place] Cardiologist:  No primary care provider on file.  [facility] Electrophysiologist:  None   Referring MD: Theresa Duty, PA    History of Present Illness:    Dawn Barton is a 54 y.o. female with history of hypothyroidism who was referred by Theresa Duty, PA for evaluation of chest pain.   Past Medical History:  Diagnosis Date  . 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 Complication of anesthesia    slow to wake up  . Hypothyroidism   . Radiation 11/12/2011-12/26/2011   5040 cGy    Past Surgical History:  Procedure Laterality Date  . BREAST LUMPECTOMY  10/02/2011   Procedure: LUMPECTOMY;  Surgeon: Joyice Faster. Cornett, MD;  Location: [place];  Service: General;  Laterality: Right;  right breast lumpectomy  . BREAST SURGERY  2003   breast lift  . COLONOSCOPY N/A 10/14/2016   redundant colon, nonthrombosed external hemorrhoids, large internal hemorrhoids s/p banding x3.   Marland Kitchen HEMORRHOID BANDING N/A 10/14/2016   Procedure: HEMORRHOID BANDING;  Surgeon: Danie Binder, MD;  [facility];  Service: Endoscopy;  Laterality: N/A;  . LUMBAR [place] SURGERY  08/2012   L4-5 microdiscectomy   . TUBAL LIGATION      Current Medications: No outpatient medications have been marked as taking for the 10/18/20 encounter (Appointment) with Freada Bergeron, MD.     Allergies:   Patient has no known allergies.   Social History   Socioeconomic History  . Marital status: Married    Spouse name: Not on file  . Number of children: 2  . Years of education: Not on file  . Highest education level: Not on file  Occupational History  . 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 Smoking status: Former Research scientist (life sciences)  . Smokeless tobacco: Never Used   Substance and Sexual Activity  . Alcohol use: Yes    Comment: social drinker  . Drug use: No  . Sexual activity: Yes    Birth control/protection: Post-menopausal, Surgical    Comment: Tubal ligation  Other Topics Concern  . Not on file  Social History Narrative  . Not on file   Social Determinants of Health   Financial Resource Strain: Not on file  Food Insecurity: Not on file  Transportation Needs: Not on file  Physical Activity: Not on file  Stress: Not on file  Social Connections: Not on file     Family History: The patient's ***family history includes Atrial fibrillation in her mother; COPD in her mother; Cancer in her father; Sleep apnea in her brother. There is no history of Colon cancer, Celiac disease, Inflammatory bowel disease, or Thyroid disease.  ROS:   Please see the history of present illness.    *** All other systems reviewed and are negative.  EKGs/Labs/Other Studies Reviewed:    The following studies were reviewed today: ***  EKG:  EKG is *** ordered today.  The ekg ordered today demonstrates ***  Recent Labs: 03/07/2020: BUN 17; Creatinine 0.7 08/15/2020: TSH 2.40  Recent Lipid Panel    Component Value Date/Time   CHOL 198 08/15/2020 1521   TRIG 84.0 08/15/2020 1521   HDL 63.50 08/15/2020 1521   CHOLHDL 3 08/15/2020 1521   VLDL  16.8 08/15/2020 1521   LDLCALC 118 (H) 08/15/2020 1521     Risk Assessment/Calculations:   {Does this patient have ATRIAL FIBRILLATION?[phone]}   Physical Exam:    VS:  There were no vitals taken for this visit.    Wt Readings from Last 3 Encounters:  08/30/20 178 lb (80.7 kg)  08/15/20 180 lb (81.6 kg)  08/07/20 179 lb 6.4 oz (81.4 kg)     GEN: *** Well nourished, well developed in no acute distress HEENT: Normal NECK: No JVD; No carotid bruits LYMPHATICS: No lymphadenopathy CARDIAC: ***RRR, no murmurs, rubs, gallops RESPIRATORY:  Clear to auscultation without rales, wheezing or rhonchi  ABDOMEN:  Soft, non-tender, non-distended MUSCULOSKELETAL:  No edema; No deformity  SKIN: Warm and dry NEUROLOGIC:  Alert and oriented x 3 PSYCHIATRIC:  Normal affect   ASSESSMENT:    No diagnosis found. PLAN:    In order of problems listed above:  1. ***   {Are you ordering a CV Procedure (e.g. stress test, cath, DCCV, TEE, etc)?   Press F2        :[id number]}    Medication Adjustments/Labs and Tests Ordered: Current medicines are reviewed at length with the patient today.  Concerns regarding medicines are outlined above.  No orders of the defined types were placed in this encounter.  No orders of the defined types were placed in this encounter.   There are no Patient Instructions on file for this visit.   Signed, Freada Bergeron, MD  10/15/2020 1:06 PM    [place]

## 2020-10-16 ENCOUNTER — Encounter: Payer: 59 | Admitting: Gastroenterology

## 2020-10-17 ENCOUNTER — Other Ambulatory Visit (HOSPITAL_COMMUNITY): Payer: Self-pay | Admitting: Family Medicine

## 2020-10-17 ENCOUNTER — Telehealth: Payer: Self-pay | Admitting: Cardiology

## 2020-10-17 MED FILL — WEGOVY 0.5 MG/0.5ML SOAJ: 0.5 | 28 days supply | Qty: 2 | Fill #0

## 2020-10-17 NOTE — Telephone Encounter (Signed)
Patient called wanting a sooner appt with Dr. Johney Frame. Her appt for tomorrow was cancelled and rescheduled due to schedule change. Please call.

## 2020-10-18 ENCOUNTER — Ambulatory Visit: Payer: 59 | Admitting: Cardiology

## 2020-10-18 NOTE — Telephone Encounter (Signed)
Spoke with pt and she is going to check her schedule and call back.

## 2020-10-18 NOTE — Telephone Encounter (Signed)
We can try tomorrow at 11:40am or 4:20pm if either of those times work for her. It may be a bit of a wait, but we can get her in for sure!

## 2020-10-19 ENCOUNTER — Other Ambulatory Visit: Payer: Self-pay

## 2020-10-19 ENCOUNTER — Other Ambulatory Visit (HOSPITAL_COMMUNITY): Payer: Self-pay | Admitting: Internal Medicine

## 2020-10-19 ENCOUNTER — Ambulatory Visit (HOSPITAL_COMMUNITY)
Admission: RE | Admit: 2020-10-19 | Discharge: 2020-10-19 | Disposition: A | Discharge status: Home / Self Care | Payer: 59 | Source: Ambulatory Visit | Attending: Internal Medicine | Admitting: Internal Medicine

## 2020-10-19 ENCOUNTER — Encounter (HOSPITAL_COMMUNITY): Payer: Self-pay | Admitting: Internal Medicine

## 2020-10-19 VITALS — BP 122/70 | HR 63 | Wt 178.6 lb

## 2020-10-19 DIAGNOSIS — R0609 Other forms of dyspnea: Secondary | ICD-10-CM | POA: Insufficient documentation

## 2020-10-19 DIAGNOSIS — Z79899 Other long term (current) drug therapy: Secondary | ICD-10-CM | POA: Diagnosis not present

## 2020-10-19 DIAGNOSIS — R0600 Dyspnea, unspecified: Secondary | ICD-10-CM

## 2020-10-19 DIAGNOSIS — Z853 Personal history of malignant neoplasm of breast: Secondary | ICD-10-CM | POA: Diagnosis not present

## 2020-10-19 DIAGNOSIS — E039 Hypothyroidism, unspecified: Secondary | ICD-10-CM | POA: Insufficient documentation

## 2020-10-19 DIAGNOSIS — Z87891 Personal history of nicotine dependence: Secondary | ICD-10-CM | POA: Diagnosis not present

## 2020-10-19 DIAGNOSIS — R002 Palpitations: Secondary | ICD-10-CM | POA: Insufficient documentation

## 2020-10-19 DIAGNOSIS — R5383 Other fatigue: Secondary | ICD-10-CM | POA: Diagnosis not present

## 2020-10-19 DIAGNOSIS — R42 Dizziness and giddiness: Secondary | ICD-10-CM | POA: Insufficient documentation

## 2020-10-19 NOTE — Patient Instructions (Signed)
Your physician has requested that you have an echocardiogram. Echocardiography is a painless test that uses sound waves to create images of your heart. It provides your doctor with information about the size and shape of your heart and how well your heart's chambers and valves are working. This procedure takes approximately one hour. There are no restrictions for this procedure.  Your provider has recommended that  you wear a Zio Patch for 14 days.  This monitor will record your heart rhythm for our review.  IF you have any symptoms while wearing the monitor please press the button.  If you have any issues with the patch or you notice a red or orange light on it please call the company at [phone].  Once you remove the patch please mail it back to the company as soon as possible so we can get the results.  Coronary Calcium Score at [facility], the cost of this $150, they will call you to schedule this

## 2020-10-19 NOTE — Progress Notes (Signed)
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   No known cardiac issues. Has been very fit. Did two [employer] in 2019 without problem. Mother had aortic stenosis, mitral regurgitation and HF.  About 2 months ago developed lightheadedness, palpitations and fatigue. Was struggling just to run a few miles. Had her thyroid checked and it was ok but cholesterol was high (TC 221/LDL 119) and that concerned her with her family Hx. About a month ago was doing a [employer] and felt presyncopal. HR was 150. Now she is back to running but has to go slow. No chest pressure. Continues to notice that HR shoot up into 130-140 with any activity. Notes that she gets a flip-flop in her heart every couple days. Husband says she snores at night. No apnea. In November did [place] and walked fast. Has also been having some vertigo. Has been post menopausal since age 20.    Review of Systems: [y] = yes, [ ]  = no   General: Weight gain [ ] ; Weight loss [ ] ; Anorexia [ ] ; Fatigue [ y]; Fever [ ] ; Chills [ ] ; Weakness [ ]   Cardiac: Chest pain/pressure [ ] ; Resting SOB [ ] ; Exertional SOB [ y]; Orthopnea [ ] ; Pedal Edema [ ] ; Palpitations [ ] ; Syncope [ ] ; Presyncope [ y]; Paroxysmal nocturnal dyspnea[ ]   Pulmonary: Cough [ ] ; Wheezing[ ] ; Hemoptysis[ ] ; Sputum [ ] ; Snoring Blue.Reese ]  GI: Vomiting[ ] ; Dysphagia[ ] ; Melena[ ] ; Hematochezia [ ] ; Heartburn[ ] ; Abdominal pain [ ] ; Constipation [ ] ; Diarrhea [ ] ; BRBPR [ ]   GU: Hematuria[ ] ; Dysuria [ ] ; Nocturia[ ]   Vascular: Pain in legs with walking [ ] ; Pain in feet with lying flat [ ] ; Non-healing sores [ ] ; Stroke [ ] ; TIA [ ] ; Slurred speech [ ] ;  Neuro:  Headaches[ ] ; Vertigo[ ] ; Seizures[ ] ; Paresthesias[ ] ;Blurred vision [ ] ; Diplopia [ ] ; Vision changes [ ]   Ortho/Skin: Arthritis [ ] ; Joint pain [ ] ; Muscle pain [ ] ; Joint swelling [ ] ; Back Pain [ ] ; Rash [ ]   Psych: Depression[y ]; Anxiety[ ]   Heme: Bleeding problems [ ] ; Clotting disorders [ ] ; Anemia [ ]   Endocrine: Diabetes [ ] ; Thyroid dysfunction[y ]   Past Medical History:  Diagnosis Date  . Body mass index 28.0-28.9, adult   . 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 Complication of anesthesia    slow to wake up  . Dizziness   . Fatigue   . Hypothyroidism   . Major depressive disorder, recurrent episode, mild ([place])   . Mixed hyperlipidemia   . Radiation 11/12/2011-12/26/2011   5040 cGy  . Vertigo, benign paroxysmal, right     Current Outpatient Medications  Medication Sig Dispense Refill  . citalopram (CELEXA) 10 MG tablet Take 10 mg by mouth at bedtime.     Marland Kitchen ibuprofen (ADVIL,MOTRIN) 200 MG tablet Take 400 mg by mouth every 4 (four) hours as needed for moderate pain.    Marland Kitchen levothyroxine (SYNTHROID) 112 MCG tablet 112 mcg.    . naproxen sodium (ANAPROX) 220 MG tablet Take 220 mg by mouth daily as  needed (pain).    Clelia Croft Butter (PREPARATION H RE) Place rectally as needed.    . Semaglutide-Weight Management (WEGOVY) 0.25 MG/0.5ML SOAJ Inject 0.25 mg into the skin.     No current facility-administered medications for this encounter.    No Known Allergies    Social History   Socioeconomic History  . Marital status: Married    Spouse name: Not on file  . Number of children: 2  . Years of education: Not on file  . Highest education level: Not on file  Occupational History  . 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 Smoking status: Former Research scientist (life sciences)  . Smokeless tobacco: Never Used  Substance and Sexual Activity  . Alcohol use: Yes    Comment: social drinker  . Drug use: No  .  Sexual activity: Yes    Birth control/protection: Post-menopausal, Surgical    Comment: Tubal ligation  Other Topics Concern  . Not on file  Social History Narrative  . Not on file   Social Determinants of Health   Financial Resource Strain: Not on file  Food Insecurity: Not on file  Transportation Needs: Not on file  Physical Activity: Not on file  Stress: Not on file  Social Connections: Not on file  Intimate Partner Violence: Not on file      Family History  Problem Relation Age of Onset  . COPD Mother   . Atrial fibrillation Mother   . Cancer Father        NSCLC  . Sleep apnea Brother   . Colon cancer Neg Hx   . Celiac disease Neg Hx   . Inflammatory bowel disease Neg Hx   . Thyroid disease Neg Hx    Brother with severe CAD and OSA  Vitals:   10/19/20 1456  BP: 122/70  Pulse: 63  SpO2: 97%  Weight: 81 kg (178 lb 9.6 oz)    PHYSICAL EXAM: General:  Well appearing. No respiratory difficulty HEENT: normal Neck: supple. no JVD. Carotids 2+ bilat; no bruits. No lymphadenopathy or thryomegaly appreciated. Cor: PMI nondisplaced. Regular rate & rhythm. No rubs, gallops or murmurs. Lungs: clear Abdomen: soft, nontender, nondistended. No hepatosplenomegaly. No bruits or masses. Good bowel sounds. Extremities: no cyanosis, clubbing, rash, edema Neuro: alert & oriented x 3, cranial nerves grossly intact. moves all 4 extremities w/o difficulty. Affect pleasant.  ECG: NSR 66 No ST-T wave abnormalities. Personally reviewed   ASSESSMENT & PLAN:  1. Palpitations and exertional dyspnea - suspect she is having frequent PVCs  - will check Zio monitor and echo (has h/o chest radiation) - given family history will also get coronary calcium score - if symptoms persist will need CPX and sleep study  Glori Bickers, MD  4:33 PM

## 2020-10-25 ENCOUNTER — Other Ambulatory Visit: Payer: Self-pay

## 2020-10-25 ENCOUNTER — Ambulatory Visit (INDEPENDENT_AMBULATORY_CARE_PROVIDER_SITE_OTHER)
Admission: RE | Admit: 2020-10-25 | Discharge: 2020-10-25 | Disposition: A | Discharge status: Home / Self Care | Payer: Self-pay | Source: Ambulatory Visit | Attending: Internal Medicine | Admitting: Internal Medicine

## 2020-10-25 DIAGNOSIS — R002 Palpitations: Secondary | ICD-10-CM

## 2020-11-01 ENCOUNTER — Ambulatory Visit (HOSPITAL_COMMUNITY)
Admission: RE | Admit: 2020-11-01 | Discharge: 2020-11-01 | Disposition: A | Discharge status: Home / Self Care | Payer: 59 | Source: Ambulatory Visit | Attending: Physician Assistant | Admitting: Physician Assistant

## 2020-11-01 ENCOUNTER — Other Ambulatory Visit: Payer: Self-pay

## 2020-11-01 DIAGNOSIS — I351 Nonrheumatic aortic (valve) insufficiency: Secondary | ICD-10-CM | POA: Diagnosis not present

## 2020-11-01 DIAGNOSIS — E039 Hypothyroidism, unspecified: Secondary | ICD-10-CM | POA: Insufficient documentation

## 2020-11-01 DIAGNOSIS — E785 Hyperlipidemia, unspecified: Secondary | ICD-10-CM | POA: Diagnosis not present

## 2020-11-01 DIAGNOSIS — R002 Palpitations: Secondary | ICD-10-CM | POA: Diagnosis not present

## 2020-11-01 LAB — ECHOCARDIOGRAM COMPLETE
Area-P 1/2: 3.6 cm2
P 1/2 time: 584 msec
S' Lateral: 3.1 cm

## 2020-11-01 NOTE — Progress Notes (Signed)
  Echocardiogram 2D Echocardiogram has been performed.  Dawn Barton G Taiwo Fish 11/01/2020, 1:52 PM

## 2020-11-04 NOTE — Progress Notes (Deleted)
Cardiology Office Note:    Date:  11/04/2020   ID:  Dawn Barton, DOB 08-06-1966, MRN [id number]  PCP:  Theresa Duty, [place] [facility] Cardiologist:  No primary care provider on file.  [facility] Electrophysiologist:  None   Referring MD: Theresa Duty, PA    History of Present Illness:    Dawn Barton is a 55 y.o. female with a hx of ***  Past Medical History:  Diagnosis Date  . Body mass index 28.0-28.9, adult   . 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 Complication of anesthesia    slow to wake up  . Dizziness   . Fatigue   . Hypothyroidism   . Major depressive disorder, recurrent episode, mild ([place])   . Mixed hyperlipidemia   . Radiation 11/12/2011-12/26/2011   5040 cGy  . Vertigo, benign paroxysmal, right     Past Surgical History:  Procedure Laterality Date  . BREAST LUMPECTOMY  10/02/2011   Procedure: LUMPECTOMY;  Surgeon: Joyice Faster. Cornett, MD;  Location: [place];  Service: General;  Laterality: Right;  right breast lumpectomy  . BREAST SURGERY  2003   breast lift  . COLONOSCOPY N/A 10/14/2016   redundant colon, nonthrombosed external hemorrhoids, large internal hemorrhoids s/p banding x3.   Marland Kitchen HEMORRHOID BANDING N/A 10/14/2016   Procedure: HEMORRHOID BANDING;  Surgeon: Danie Binder, MD;  [facility];  Service: Endoscopy;  Laterality: N/A;  . LUMBAR [place] SURGERY  08/2012   L4-5 microdiscectomy   . TUBAL LIGATION      Current Medications: No outpatient medications have been marked as taking for the 11/16/20 encounter (Appointment) with Freada Bergeron, MD.     Allergies:   Patient has no known allergies.   Social History   Socioeconomic History  . Marital status: Married    Spouse name: Not on file  . Number of children: 2  . Years of education: Not on file  . Highest education level: Not on file  Occupational History  . 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 Smoking status: Former Research scientist (life sciences)  . Smokeless tobacco: Never Used  Substance and Sexual Activity  . Alcohol use: Yes    Comment: social drinker  . Drug use: No  . Sexual activity: Yes    Birth control/protection: Post-menopausal, Surgical    Comment: Tubal ligation  Other Topics Concern  . Not on file  Social History Narrative  . Not on file   Social Determinants of Health   Financial Resource Strain: Not on file  Food Insecurity: Not on file  Transportation Needs: Not on file  Physical Activity: Not on file  Stress: Not on file  Social Connections: Not on file     Family History: The patient's ***family history includes Atrial fibrillation in her mother; COPD in her mother; Cancer in her father; Sleep apnea in her brother. There is no history of Colon cancer, Celiac disease, Inflammatory bowel disease, or Thyroid disease.  ROS:   Please see the history of present illness.    *** All other systems reviewed and are negative.  EKGs/Labs/Other Studies Reviewed:    The following studies were reviewed today: ***  EKG:  EKG is *** ordered today.  The ekg ordered today demonstrates ***  Recent Labs: 03/07/2020: BUN 17; Creatinine 0.7 08/15/2020: TSH 2.40  Recent Lipid Panel    Component Value Date/Time  CHOL 198 08/15/2020 1521   TRIG 84.0 08/15/2020 1521   HDL 63.50 08/15/2020 1521   CHOLHDL 3 08/15/2020 1521   VLDL 16.8 08/15/2020 1521   LDLCALC 118 (H) 08/15/2020 1521     Risk Assessment/Calculations:   {Does this patient have ATRIAL FIBRILLATION?:[phone]}   Physical Exam:    VS:  There were no vitals taken for this visit.    Wt Readings from Last 3 Encounters:  10/19/20 178 lb 9.6 oz (81 kg)  08/30/20 178 lb (80.7 kg)  08/15/20 180 lb (81.6 kg)     GEN: *** Well nourished, well developed in no acute distress HEENT: Normal NECK: No JVD; No carotid bruits LYMPHATICS: No lymphadenopathy CARDIAC: ***RRR, no  murmurs, rubs, gallops RESPIRATORY:  Clear to auscultation without rales, wheezing or rhonchi  ABDOMEN: Soft, non-tender, non-distended MUSCULOSKELETAL:  No edema; No deformity  SKIN: Warm and dry NEUROLOGIC:  Alert and oriented x 3 PSYCHIATRIC:  Normal affect   ASSESSMENT:    No diagnosis found. PLAN:    In order of problems listed above:  1. ***   {Are you ordering a CV Procedure (e.g. stress test, cath, DCCV, TEE, etc)?   Press F2        :[id number]}    Medication Adjustments/Labs and Tests Ordered: Current medicines are reviewed at length with the patient today.  Concerns regarding medicines are outlined above.  No orders of the defined types were placed in this encounter.  No orders of the defined types were placed in this encounter.   There are no Patient Instructions on file for this visit.   Signed, Meriam Sprague, MD  11/04/2020 12:18 PM    [facility]

## 2020-11-09 DIAGNOSIS — R002 Palpitations: Secondary | ICD-10-CM | POA: Diagnosis not present

## 2020-11-12 NOTE — Addendum Note (Signed)
Encounter addended by: Micki Riley, RN on: 11/12/2020 1:01 PM  Actions taken: Imaging Exam ended

## 2020-11-16 ENCOUNTER — Ambulatory Visit: Payer: 59 | Admitting: Cardiology

## 2020-11-17 ENCOUNTER — Other Ambulatory Visit (HOSPITAL_COMMUNITY): Payer: Self-pay | Admitting: Family Medicine

## 2020-11-21 NOTE — Addendum Note (Signed)
Encounter addended by: Micki Riley, RN on: 11/21/2020 11:27 AM  Actions taken: Imaging Exam ended

## 2021-01-07 ENCOUNTER — Other Ambulatory Visit (HOSPITAL_COMMUNITY): Payer: Self-pay | Admitting: Family Medicine

## 2021-01-09 MED FILL — SAXENDA 18 MG/3 ML PEN: 18 | 30 days supply | Qty: 15 | Fill #0

## 2021-01-10 ENCOUNTER — Other Ambulatory Visit (HOSPITAL_COMMUNITY): Payer: Self-pay | Admitting: Family Medicine

## 2021-01-11 MED FILL — UNIFINE PENTIPS 32GX5/32: 32G X 4 MM | 90 days supply | Qty: 100 | Fill #0

## 2021-01-11 MED FILL — ATOMOXETINE HCL 40 MG CAPS: 40 | 90 days supply | Qty: 180 | Fill #0

## 2021-02-12 MED FILL — Liraglutide (Weight Mngmt) Soln Pen-Inj 18 MG/3ML (6 MG/ML): SUBCUTANEOUS | 30 days supply | Qty: 15 | Fill #0 | Status: AC

## 2021-02-13 ENCOUNTER — Other Ambulatory Visit (HOSPITAL_COMMUNITY): Payer: Self-pay

## 2021-03-13 DIAGNOSIS — H524 Presbyopia: Secondary | ICD-10-CM | POA: Diagnosis not present

## 2021-03-14 ENCOUNTER — Other Ambulatory Visit (HOSPITAL_COMMUNITY): Payer: Self-pay

## 2021-03-14 MED ORDER — VALACYCLOVIR HCL 1 G PO TABS
1.0000 g | ORAL_TABLET | Freq: Two times a day (BID) | ORAL | 1 refills | Status: DC
  Filled 2021-03-14: qty 90, 45d supply, fill #0

## 2021-03-22 ENCOUNTER — Other Ambulatory Visit (HOSPITAL_COMMUNITY): Payer: Self-pay

## 2021-03-25 ENCOUNTER — Other Ambulatory Visit (HOSPITAL_COMMUNITY): Payer: Self-pay | Admitting: Oncology

## 2021-03-25 ENCOUNTER — Other Ambulatory Visit (HOSPITAL_COMMUNITY): Payer: Self-pay | Admitting: Family Medicine

## 2021-03-25 DIAGNOSIS — Z1231 Encounter for screening mammogram for malignant neoplasm of breast: Secondary | ICD-10-CM

## 2021-04-05 ENCOUNTER — Encounter (HOSPITAL_COMMUNITY): Payer: Self-pay

## 2021-04-05 ENCOUNTER — Ambulatory Visit (HOSPITAL_COMMUNITY)
Admission: RE | Admit: 2021-04-05 | Discharge: 2021-04-05 | Disposition: A | Discharge status: Home / Self Care | Payer: 59 | Source: Ambulatory Visit | Attending: Oncology | Admitting: Oncology

## 2021-04-05 DIAGNOSIS — Z1231 Encounter for screening mammogram for malignant neoplasm of breast: Secondary | ICD-10-CM | POA: Diagnosis not present

## 2021-04-25 ENCOUNTER — Other Ambulatory Visit (HOSPITAL_COMMUNITY): Payer: Self-pay

## 2021-04-25 MED ORDER — RYBELSUS 3 MG PO TABS
3.0000 mg | ORAL_TABLET | Freq: Every day | ORAL | 1 refills | Status: DC
  Filled 2021-04-25: qty 30, 30d supply, fill #0

## 2021-05-14 ENCOUNTER — Other Ambulatory Visit (HOSPITAL_COMMUNITY): Payer: Self-pay

## 2021-05-14 MED ORDER — MOUNJARO 2.5 MG/0.5ML ~~LOC~~ SOAJ
2.5000 mg | SUBCUTANEOUS | 3 refills | Status: DC
  Filled 2021-05-14: qty 2, 28d supply, fill #0
  Filled 2021-06-13: qty 6, 84d supply, fill #1

## 2021-05-15 ENCOUNTER — Other Ambulatory Visit (HOSPITAL_COMMUNITY): Payer: Self-pay

## 2021-06-07 ENCOUNTER — Other Ambulatory Visit (HOSPITAL_COMMUNITY): Payer: Self-pay

## 2021-06-13 ENCOUNTER — Other Ambulatory Visit (HOSPITAL_COMMUNITY): Payer: Self-pay

## 2021-06-25 ENCOUNTER — Other Ambulatory Visit (HOSPITAL_COMMUNITY): Payer: Self-pay

## 2021-06-25 DIAGNOSIS — F419 Anxiety disorder, unspecified: Secondary | ICD-10-CM | POA: Diagnosis not present

## 2021-06-25 DIAGNOSIS — J019 Acute sinusitis, unspecified: Secondary | ICD-10-CM | POA: Diagnosis not present

## 2021-06-25 DIAGNOSIS — E669 Obesity, unspecified: Secondary | ICD-10-CM | POA: Diagnosis not present

## 2021-06-25 DIAGNOSIS — J302 Other seasonal allergic rhinitis: Secondary | ICD-10-CM | POA: Diagnosis not present

## 2021-06-25 DIAGNOSIS — E559 Vitamin D deficiency, unspecified: Secondary | ICD-10-CM | POA: Diagnosis not present

## 2021-06-25 DIAGNOSIS — E039 Hypothyroidism, unspecified: Secondary | ICD-10-CM | POA: Diagnosis not present

## 2021-06-25 DIAGNOSIS — T7840XA Allergy, unspecified, initial encounter: Secondary | ICD-10-CM | POA: Diagnosis not present

## 2021-06-25 MED ORDER — MOUNJARO 7.5 MG/0.5ML ~~LOC~~ SOAJ
7.5000 mg | SUBCUTANEOUS | 0 refills | Status: DC
  Filled 2021-06-25 – 2021-07-18 (×2): qty 6, 84d supply, fill #0

## 2021-06-25 MED ORDER — MONTELUKAST SODIUM 10 MG PO TABS
10.0000 mg | ORAL_TABLET | Freq: Every evening | ORAL | 1 refills | Status: DC
  Filled 2021-06-25: qty 90, 90d supply, fill #0

## 2021-06-26 ENCOUNTER — Other Ambulatory Visit (HOSPITAL_COMMUNITY): Payer: Self-pay

## 2021-06-28 ENCOUNTER — Other Ambulatory Visit (HOSPITAL_COMMUNITY): Payer: Self-pay

## 2021-07-03 ENCOUNTER — Other Ambulatory Visit (HOSPITAL_COMMUNITY): Payer: Self-pay

## 2021-07-04 ENCOUNTER — Telehealth: Payer: 59 | Admitting: Family Medicine

## 2021-07-04 ENCOUNTER — Other Ambulatory Visit (HOSPITAL_COMMUNITY): Payer: Self-pay

## 2021-07-04 DIAGNOSIS — H05011 Cellulitis of right orbit: Secondary | ICD-10-CM

## 2021-07-04 MED ORDER — SULFAMETHOXAZOLE-TRIMETHOPRIM 800-160 MG PO TABS
1.0000 | ORAL_TABLET | Freq: Two times a day (BID) | ORAL | 0 refills | Status: AC
  Filled 2021-07-04: qty 14, 7d supply, fill #0

## 2021-07-04 MED ORDER — AMOXICILLIN-POT CLAVULANATE 875-125 MG PO TABS
1.0000 | ORAL_TABLET | Freq: Two times a day (BID) | ORAL | 0 refills | Status: AC
  Filled 2021-07-04: qty 14, 7d supply, fill #0

## 2021-07-04 NOTE — Progress Notes (Signed)
E Visit for Cellulitis  We are sorry that you are not feeling well. Here is how we plan to help!  Based on what you shared with me it looks like you have cellulitis.  Cellulitis looks like areas of skin redness, swelling, and warmth; it develops as a result of bacteria entering under the skin. Little red spots and/or bleeding can be seen in skin, and tiny surface sacs containing fluid can occur. Fever can be present. Cellulitis is almost always on one side of a body, and the lower limbs are the most common site of involvement.   I have prescribed:  Bactrim DS 1 tablet by mouth twice a day for 7 days In addition to Augmentin as well  Bactrim is good coverage for risk of this being MRSA. That is why you will be on two different medications.   HOME CARE:  Take your medications as ordered and take all of them, even if the skin irritation appears to be healing.   GET HELP RIGHT AWAY IF:  Symptoms that don't begin to go away within 48 hours. Severe redness persists or worsens If the area turns color, spreads or swells. If it blisters and opens, develops yellow-brown crust or bleeds. You develop a fever or chills. If the pain increases or becomes unbearable.  Are unable to keep fluids and food down.  MAKE SURE YOU   Understand these instructions. Will watch your condition. Will get help right away if you are not doing well or get worse.  Thank you for choosing an e-visit.  Your e-visit answers were reviewed by a board certified advanced clinical practitioner to complete your personal care plan. Depending upon the condition, your plan could have included both over the counter or prescription medications.  Please review your pharmacy choice. Make sure the pharmacy is open so you can pick up prescription now. If there is a problem, you may contact your provider through [employer] and have the prescription routed to another pharmacy.  Your safety is important to [place]. If you have drug  allergies check your prescription carefully.   For the next 24 hours you can use MyChart to ask questions about today's visit, request a non-urgent call back, or ask for a work or school excuse. You will get an email in the next two days asking about your experience. I hope that your e-visit has been valuable and will speed your recovery.   I provided 7 minutes of non face-to-face time during this encounter for chart review, medication and order placement, as well as and documentation.

## 2021-07-05 ENCOUNTER — Other Ambulatory Visit (HOSPITAL_COMMUNITY): Payer: Self-pay

## 2021-07-09 ENCOUNTER — Other Ambulatory Visit (HOSPITAL_COMMUNITY): Payer: Self-pay

## 2021-07-09 DIAGNOSIS — L2389 Allergic contact dermatitis due to other agents: Secondary | ICD-10-CM | POA: Diagnosis not present

## 2021-07-09 MED ORDER — VANIQA 13.9 % EX CREA
TOPICAL_CREAM | CUTANEOUS | 3 refills | Status: DC
  Filled 2021-07-09: qty 45, 20d supply, fill #0
  Filled 2021-07-10: qty 45, 30d supply, fill #0

## 2021-07-10 ENCOUNTER — Other Ambulatory Visit (HOSPITAL_COMMUNITY): Payer: Self-pay

## 2021-07-19 ENCOUNTER — Other Ambulatory Visit (HOSPITAL_COMMUNITY): Payer: Self-pay

## 2021-07-22 ENCOUNTER — Other Ambulatory Visit (HOSPITAL_COMMUNITY): Payer: Self-pay

## 2021-07-23 DIAGNOSIS — L308 Other specified dermatitis: Secondary | ICD-10-CM | POA: Diagnosis not present

## 2021-07-23 DIAGNOSIS — L258 Unspecified contact dermatitis due to other agents: Secondary | ICD-10-CM | POA: Diagnosis not present

## 2021-08-02 ENCOUNTER — Other Ambulatory Visit (HOSPITAL_COMMUNITY): Payer: Self-pay

## 2021-08-02 MED ORDER — MOUNJARO 10 MG/0.5ML ~~LOC~~ SOAJ
10.0000 mg | SUBCUTANEOUS | 1 refills | Status: DC
  Filled 2021-08-02: qty 6, 84d supply, fill #0
  Filled 2021-10-18: qty 6, 84d supply, fill #1

## 2021-08-05 ENCOUNTER — Other Ambulatory Visit (HOSPITAL_COMMUNITY): Payer: Self-pay

## 2021-10-18 ENCOUNTER — Other Ambulatory Visit (HOSPITAL_COMMUNITY): Payer: Self-pay

## 2021-10-22 ENCOUNTER — Ambulatory Visit: Payer: 59 | Admitting: Dermatology

## 2021-12-09 ENCOUNTER — Other Ambulatory Visit (HOSPITAL_COMMUNITY): Payer: Self-pay

## 2021-12-09 MED ORDER — MOUNJARO 12.5 MG/0.5ML ~~LOC~~ SOAJ
12.5000 mg | SUBCUTANEOUS | 3 refills | Status: DC
  Filled 2021-12-09 – 2022-01-10 (×2): qty 2, 28d supply, fill #0
  Filled 2022-01-27: qty 2, 28d supply, fill #1

## 2022-01-01 ENCOUNTER — Encounter: Payer: 59 | Admitting: Gastroenterology

## 2022-01-09 ENCOUNTER — Encounter: Payer: Self-pay | Admitting: Gastroenterology

## 2022-01-09 ENCOUNTER — Ambulatory Visit: Payer: 59 | Admitting: Gastroenterology

## 2022-01-09 ENCOUNTER — Other Ambulatory Visit: Payer: Self-pay

## 2022-01-09 VITALS — BP 120/70 | HR 82 | Temp 97.3°F | Ht 65.0 in | Wt 160.8 lb

## 2022-01-09 DIAGNOSIS — K642 Third degree hemorrhoids: Secondary | ICD-10-CM

## 2022-01-09 NOTE — Progress Notes (Signed)
? ? ? ? ? ?  [place] BANDING PROCEDURE NOTE ? ?Dawn Barton is a 56 y.o. female presenting today for consideration of hemorrhoid banding. 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 Last colonoscopy in 2017 by Dr. Oneida Alar with redundant colon, nonthrombosed external hemorrhoids, large internal hemorrhoids s/p banding x3. She has had left lateral and right posterior banding. Presents today with recurrence. Not interested in pursuing colonoscopy right now. Bleeding, pressure, soiling noted.  ? ? ?The patient presents with symptomatic grade 3 hemorrhoids, unresponsive to maximal medical therapy, requesting rubber band ligation of her hemorrhoidal disease. All risks, benefits, and alternative forms of therapy were described and informed consent was obtained. ? ? ?The decision was made to band the right anterior initially, but there was insufficient tissue. I then turned attention to the left lateral internal hemorrhoid, and the [place] was used to perform band ligation without complication. Digital anorectal examination was then performed to assure proper positioning of the band, and to adjust the banded tissue as required. The patient was discharged home without pain or other issues. Dietary and behavioral recommendations were given, along with follow-up instructions. The patient will return in several weeks for followup and possible additional banding as required. ? ?No complications were encountered and the patient tolerated the procedure well.  ? ?Annitta Needs, PhD, ANP-BC ?[facility]  ? ?

## 2022-01-09 NOTE — Patient Instructions (Signed)
I will see you back in a few weeks for banding! ? ?Please continue to avoid straining. ? ?You should limit your toilet time to 2-3 minutes at the most.  ? ?Continue to avoid constipation. ? ?Please call me with any concerns or issues! ? ? ? ?I enjoyed seeing you again today! As you know, I value our relationship and want to provide genuine, compassionate, and quality care. I welcome your feedback. If you receive a survey regarding your visit,  I greatly appreciate you taking time to fill this out. See you next time! ? ?Annitta Needs, PhD, ANP-BC ?[facility]  ? ? ? ? ? ? ? ? ? ?

## 2022-01-10 ENCOUNTER — Other Ambulatory Visit (HOSPITAL_COMMUNITY): Payer: Self-pay

## 2022-01-21 ENCOUNTER — Encounter: Payer: Self-pay | Admitting: Internal Medicine

## 2022-01-27 ENCOUNTER — Other Ambulatory Visit (HOSPITAL_COMMUNITY): Payer: Self-pay

## 2022-01-29 ENCOUNTER — Other Ambulatory Visit (HOSPITAL_COMMUNITY): Payer: Self-pay

## 2022-01-30 ENCOUNTER — Other Ambulatory Visit (HOSPITAL_COMMUNITY): Payer: Self-pay

## 2022-01-30 MED ORDER — MOUNJARO 15 MG/0.5ML ~~LOC~~ SOAJ
15.0000 mg | SUBCUTANEOUS | 6 refills | Status: DC
  Filled 2022-01-30 (×2): qty 2, 28d supply, fill #0
  Filled 2022-04-16: qty 2, 28d supply, fill #1

## 2022-01-30 MED ORDER — VALACYCLOVIR HCL 1 G PO TABS
2000.0000 mg | ORAL_TABLET | Freq: Two times a day (BID) | ORAL | 5 refills | Status: DC
  Filled 2022-01-30 (×3): qty 120, 30d supply, fill #0

## 2022-02-06 ENCOUNTER — Encounter: Payer: Self-pay | Admitting: Gastroenterology

## 2022-02-06 ENCOUNTER — Ambulatory Visit: Payer: 59 | Admitting: Gastroenterology

## 2022-02-06 VITALS — BP 110/62 | HR 69 | Temp 96.9°F | Ht 65.0 in | Wt 155.0 lb

## 2022-02-06 DIAGNOSIS — K642 Third degree hemorrhoids: Secondary | ICD-10-CM | POA: Diagnosis not present

## 2022-02-06 MED ORDER — HYDROCORTISONE (PERIANAL) 2.5 % EX CREA: 1.0000 "application " | TOPICAL_CREAM | Freq: Two times a day (BID) | CUTANEOUS | 1 refills | Status: DC

## 2022-02-06 NOTE — Progress Notes (Signed)
? ? ?  [place] BANDING PROCEDURE NOTE ? ?Dawn Barton is a 56 y.o. female presenting today for consideration of hemorrhoid banding. Last colonoscopy in 2017 by Dr. Oneida Alar with redundant colon, nonthrombosed external hemorrhoids, large internal hemorrhoids s/p banding x3. She has had left lateral X 2 and right posterior banding. Insufficient tissue right anterior on last occasion. Recurrent bleeding noted.  ? ?The patient presents with symptomatic grade 3 hemorrhoids, unresponsive to maximal medical therapy, requesting rubber band ligation of his/her hemorrhoidal disease. All risks, benefits, and alternative forms of therapy were described and informed consent was obtained. ? ?In the left lateral decubitus position, anoscopic examination revealed grade 3 hemorrhoids in the left lateral and right anterior position (s). ? ?The decision was made to band the left lateral internal hemorrhoid, and the [place] was used to perform band ligation without complication. Digital anorectal examination was then performed to assure proper positioning of the band, and to adjust the banded tissue as required. The patient was discharged home without pain or other issues. Dietary and behavioral recommendations were given, along with follow-up instructions. The patient will return in several weeks for followup and possible additional banding as required. ? ?No complications were encountered and the patient tolerated the procedure well.  ? ?Annitta Needs, PhD, ANP-BC ?[facility]  ? ?

## 2022-02-06 NOTE — Patient Instructions (Signed)
I sent in anusol cream to use twice a day per rectum. You can add in lidocaine ointment OTC if needed. ? ?We will see you back for further banding! ? ?Have a great trip! ? ?I enjoyed seeing you again today! As you know, I value our relationship and want to provide genuine, compassionate, and quality care. I welcome your feedback. If you receive a survey regarding your visit,  I greatly appreciate you taking time to fill this out. See you next time! ? ?Annitta Needs, PhD, ANP-BC ?[facility]  ? ?

## 2022-02-19 ENCOUNTER — Telehealth: Payer: 59 | Admitting: Physician Assistant

## 2022-02-19 ENCOUNTER — Other Ambulatory Visit (HOSPITAL_COMMUNITY): Payer: Self-pay

## 2022-02-19 DIAGNOSIS — H01119 Allergic dermatitis of unspecified eye, unspecified eyelid: Secondary | ICD-10-CM

## 2022-02-19 DIAGNOSIS — H1011 Acute atopic conjunctivitis, right eye: Secondary | ICD-10-CM

## 2022-02-19 MED ORDER — AZELASTINE HCL 0.05 % OP SOLN
1.0000 [drp] | Freq: Two times a day (BID) | OPHTHALMIC | 0 refills | Status: DC
  Filled 2022-02-19: qty 6, 30d supply, fill #0

## 2022-02-19 MED ORDER — PREDNISONE 10 MG PO TABS
ORAL_TABLET | ORAL | 0 refills | Status: AC
  Filled 2022-02-19: qty 27, 10d supply, fill #0

## 2022-02-19 NOTE — Progress Notes (Signed)
I have spent 5 minutes in review of e-visit questionnaire, review and updating patient chart, medical decision making and response to patient.   Inara Dike Cody Cayetano Mikita, PA-C    

## 2022-02-19 NOTE — Progress Notes (Signed)
E visit for Allergic Rhinitis ?We are sorry that you are not feeling well.  Here is how we plan to help! ? ?Based on what you have shared with me it looks like you have Allergic Rhinitis.  Rhinitis is when a reaction occurs that causes nasal congestion, runny nose, sneezing, and itching.  Most types of rhinitis are caused by an inflammation and are associated with symptoms in the eyes ears or throat. ?There are several types of rhinitis.  The most common are acute rhinitis, which is usually caused by a viral illness, allergic or seasonal rhinitis, and nonallergic or year-round rhinitis.  Nasal allergies occur certain times of the year.  Allergic rhinitis is caused when allergens in the air trigger the release of histamine in the body.  Histamine causes itching, swelling, and fluid to build up in the fragile linings of the nasal passages, sinuses and eyelids.  An itchy nose and clear discharge are common. ? ?I recommend the following over the counter treatments: ?You should take a daily dose of antihistamine and Xyzal 5 mg take 1 tablet daily ? ?I also would recommend a nasal spray: ?Flonase 2 sprays into each nostril once daily and Saline 1 spray into each nostril as needed ? ?Giving the concern for a full allergic conjunctivitis and now possible allergic dermatitis of eyelid, I am adding on a prescription eye drop called Optivar.  ? ?I will send in a steroid taper but want you to start the eye drops and antihistamine first, along with cold compresses of the eye. If not offering significant relief alone, start the steroid taper and take as directed.  ? ?HOME CARE: ? ?You can use an over-the-counter saline nasal spray as needed ?Avoid areas where there is heavy dust, mites, or molds ?Stay indoors on windy days during the pollen season ?Keep windows closed in home, at least in bedroom; use air conditioner. ?Use high-efficiency house air filter ?Keep windows closed in car, turn [facility] on re-circulate ?Avoid playing out  with dog during pollen season ? ?GET HELP RIGHT AWAY IF: ? ?If your symptoms do not improve within 10 days ?You become short of breath ?You develop yellow or green discharge from your nose for over 3 days ?You have coughing fits ? ?MAKE SURE YOU: ? ?Understand these instructions ?Will watch your condition ?Will get help right away if you are not doing well or get worse ? ?Thank you for choosing an e-visit. ?Your e-visit answers were reviewed by a board certified advanced clinical practitioner to complete your personal care plan. Depending upon the condition, your plan could have included both over the counter or prescription medications. ?Please review your pharmacy choice. Be sure that the pharmacy you have chosen is open so that you can pick up your prescription now.  If there is a problem you may message your provider in [place] to have the prescription routed to another pharmacy. ?Your safety is important to [place]. If you have drug allergies check your prescription carefully.  ?For the next 24 hours, you can use MyChart to ask questions about today?s visit, request a non-urgent call back, or ask for a work or school excuse from your e-visit provider. ?You will get an email in the next two days asking about your experience. I hope that your e-visit has been valuable and will speed your recovery. ? ? ? ? ? ? ? ?

## 2022-03-06 ENCOUNTER — Encounter: Payer: 59 | Admitting: Gastroenterology

## 2022-03-28 ENCOUNTER — Encounter: Payer: Self-pay | Admitting: Allergy & Immunology

## 2022-03-28 ENCOUNTER — Ambulatory Visit: Payer: 59 | Admitting: Allergy & Immunology

## 2022-03-28 VITALS — BP 108/74 | HR 59 | Temp 98.0°F | Resp 20 | Ht 64.0 in | Wt 154.1 lb

## 2022-03-28 DIAGNOSIS — L253 Unspecified contact dermatitis due to other chemical products: Secondary | ICD-10-CM | POA: Diagnosis not present

## 2022-03-28 DIAGNOSIS — J302 Other seasonal allergic rhinitis: Secondary | ICD-10-CM

## 2022-03-28 NOTE — Progress Notes (Signed)
NEW PATIENT  Date of Service/Encounter:  03/28/22  Consult requested by: Allwardt, Randa Evens, PA-C   Assessment:   Contact dermatitis due to chemicals  Seasonal allergic rhinitis - with symptoms worse in the spring (did not do testing because she had reportedly negative blood work)  Plan/Recommendations:   1. Contact dermatitis due to chemicals - We are going to do some patch testing on the week of June 5th at 1:30pm. - We will do readings on Wednesday June 7th at 1:30pm and Friday June 9th at 8:30am.  - You can take antihistamines during this time, but no prednisone.  - Bring in samples of your cosmetics to test as well.   2. Seasonal allergic rhinitis - Continue with an OTC antihistamine as needed. - I do not think that testing for environmental allergies is needed at this time.  - Please send results from your blood work to [place].   3. Follow up for patch testing.   Subjective:   Dawn Barton is a 56 y.o. female presenting today for evaluation of  Chief Complaint  Patient presents with   Allergies    Sneezing,itchy eyes,swelling of the eyes, the eyes feel dry, arms are also itchy,  Took Claritin and allegra for symptoms.  Main reason the itchy/swelling of the eyes.  Did bloodwork and wasn't allergic to anything(sept.) Is eating the same thing and doesn't think that it's something to do with food.     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  Dawn Barton was referred by Allwardt, Randa Evens, PA-C. She is now working   Dawn Barton is a 56 y.o. female presenting for an evaluation of mostly a facial rash, but also congestion with certain foods . She started having the eye swelling last summer out of the blue. She does have some erythema and dryness around her eyes. This was July or August 2022. She went to see her PCP in the morning. 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 It cleared up completely. Then she was fine for one month or so. Around one month later, she went to [place] and she called for a televisit for treatment. She was told by the [facility] on the other end of the line that she needed to go to the ED. She did not go to the ED despite recommendation because she did not feel that she needed it. She estimates that this has happened 4-5 times. Today it is not itching as much and it seems to be clearing up. It does go away on its own, but it never completely clears without prednisone. It did seem to get better in the winter. She went to [place] in April and it started clearing up.  There are no changes to her environment at all. She is not around animals at all. She does wear make up every day but she stopped putting stuff on her eyes because she rubs them a lot.   They are not itching  as much as they normally do. Nothing has changed at  all with regards to her cosmetics. She runs a lot and she does a lot of trail running. She does have some rhinorrhea and sneezing, but this is nothing that would bother her. Then she started to noticed that when she drinks a [employer]. She then developed instantaneous congestion. Otherwise times she would have dinner and she would get very congested. She started Claritin and this helped. She switched to [place] and it seemed to work better.   She went camping a few weekends ago. She tried some Mike's Hard Lemonade; the same thing happened with the instantaneous  congestion.  Picture of one of the worst days:     Otherwise, there is no history of other atopic diseases, including drug allergies, stinging insect allergies, eczema, or urticaria. There is no significant infectious history. Vaccinations are up to date.    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 If you have any questions, ask your nurse or doctor.          azelastine 0.05 % ophthalmic solution Commonly known as: OPTIVAR Place 1 drop into both eyes 2 (two) times daily.   citalopram 10 MG tablet Commonly known as: CELEXA Take 10 mg by mouth at bedtime.   hydrocortisone 2.5 % rectal cream Commonly known as: ANUSOL-HC Place 1 application. rectally 2 (two) times daily.   ibuprofen 200 MG tablet Commonly known as: ADVIL Take 400 mg by mouth every 4 (four) hours as needed for moderate pain.   KRILL OIL PO Take by mouth.   levothyroxine 112 MCG tablet Commonly known as: SYNTHROID 112 mcg.   montelukast 10 MG tablet Commonly known as: SINGULAIR Take 1 tablet (10 mg total) by mouth at bedtime.   Mounjaro 12.5 MG/0.5ML Pen Generic drug: tirzepatide Inject 12.5 mg into the skin once a week.   Mounjaro 15 MG/0.5ML Pen Generic drug: tirzepatide Inject 15 mg into the skin once a week.   MULTIVITAMIN ADULTS PO Take by mouth.   valACYclovir 1000 MG tablet Commonly known as: VALTREX Take 2  tablets (2,000 mg total) by mouth every 12 (twelve) hours.   VITAMIN D PO Take by mouth.   Zinc 50 MG Tabs Take by mouth.        Birth History: non-contributory  Developmental History: non-contributory  Past Surgical History: Past Surgical History:  Procedure Laterality Date   AUGMENTATION MAMMAPLASTY     BREAST LUMPECTOMY  10/02/2011   Procedure: LUMPECTOMY;  Surgeon: Joyice Faster. Cornett, MD;  Location: [place];  Service: General;  Laterality: Right;  right breast lumpectomy   BREAST SURGERY  2003   breast lift   COLONOSCOPY N/A 10/14/2016   redundant colon, nonthrombosed external hemorrhoids, large internal hemorrhoids s/p banding x3.    HEMORRHOID BANDING N/A 10/14/2016   Procedure: HEMORRHOID BANDING;  Surgeon:  Danie Binder, MD;  [facility];  Service: Endoscopy;  Laterality: N/A;   LUMBAR DISC SURGERY  08/2012   L4-5 microdiscectomy    TUBAL LIGATION       Family History: Family History  Problem Relation Age of Onset   COPD Mother    Atrial fibrillation Mother    Cancer Father        NSCLC   Sleep apnea Brother    Colon cancer Neg Hx    Celiac disease Neg Hx    Inflammatory bowel disease Neg Hx    Thyroid disease Neg Hx    Allergic rhinitis Neg Hx    Asthma Neg Hx    Eczema Neg Hx    Urticaria Neg Hx      Social History: Dawn Barton lives at home with her husband.  She has been a house that is 60 years old.  There is hardwood, carpet, and tile in the main living areas and carpeting in the bedroom.  She has a heat pump for heating and central cooling.  There are no indoor outdoor animals.  There are no dust mite covers on the bed and the pillows.  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  He is exposed to dust.  They do not live near an interstate or industrial area. 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    Review of Systems  Constitutional: Negative.  Negative for chills, fever, malaise/fatigue and  weight loss.  HENT: Negative.  Negative for congestion, ear discharge, ear pain and sinus pain.   Eyes:  Negative for pain, discharge and redness.  Respiratory:  Negative for cough, sputum production, shortness of breath and wheezing.   Cardiovascular: Negative.  Negative for chest pain and palpitations.  Gastrointestinal:  Negative for abdominal pain, constipation, diarrhea, heartburn, nausea and vomiting.  Skin:  Positive for itching and rash.  Neurological:  Negative for dizziness and headaches.  Endo/Heme/Allergies:  Negative for environmental allergies. Does not bruise/bleed easily.      Objective:   Blood pressure 108/74, pulse (!) 59, temperature 98 F (36.7 C), resp. rate 20, height '5\' 4"'$  (1.626 m), weight 154 lb 2 oz (69.9 kg), SpO2 99 %. Body mass index is 26.46 kg/m.     Physical Exam Vitals reviewed.  Constitutional:      Appearance: She is well-developed.     Comments: Very talkative.  Well spoken.  HENT:     Head: Normocephalic and atraumatic.     Right Ear: Tympanic membrane, ear canal and external ear normal. No drainage, swelling or tenderness. Tympanic membrane is not injected, scarred, erythematous, retracted or bulging.     Left Ear: Tympanic membrane, ear canal and external ear normal. No drainage, swelling or tenderness. Tympanic membrane is not injected, scarred, erythematous, retracted or bulging.     Nose: Rhinorrhea present. No nasal deformity, septal deviation or mucosal edema.     Right Turbinates: Enlarged and swollen.     Left Turbinates: Enlarged and swollen.     Right Sinus: No maxillary sinus tenderness or frontal sinus tenderness.     Left Sinus: No maxillary sinus tenderness or frontal sinus tenderness.     Mouth/Throat:     Mouth: Mucous membranes are not pale and not dry.     Pharynx: Uvula midline.  Eyes:     General:        Right eye: No discharge.  Left eye: No discharge.     Conjunctiva/sclera: Conjunctivae normal.     Right  eye: Right conjunctiva is not injected. No chemosis.    Left eye: Left conjunctiva is not injected. No chemosis.    Pupils: Pupils are equal, round, and reactive to light.  Cardiovascular:     Rate and Rhythm: Normal rate and regular rhythm.     Heart sounds: Normal heart sounds.  Pulmonary:     Effort: Pulmonary effort is normal. No tachypnea, accessory muscle usage or respiratory distress.     Breath sounds: Normal breath sounds. No wheezing, rhonchi or rales.  Chest:     Chest wall: No tenderness.  Abdominal:     Tenderness: There is no abdominal tenderness. There is no guarding or rebound.  Lymphadenopathy:     Head:     Right side of head: No submandibular, tonsillar or occipital adenopathy.     Left side of head: No submandibular, tonsillar or occipital adenopathy.     Cervical: No cervical adenopathy.  Skin:    General: Skin is warm.     Capillary Refill: Capillary refill takes less than 2 seconds.     Coloration: Skin is not pale.     Findings: Rash present. No abrasion, erythema or petechiae. Rash is not papular, urticarial or vesicular.     Comments: She does have an erythematous periorbital rash around the right eye.  It is rather macerated.  No oozing or crusting noted.  Neurological:     Mental Status: She is alert.  Psychiatric:        Behavior: Behavior is cooperative.     Diagnostic studies: none         Salvatore Marvel, MD [facility]

## 2022-03-28 NOTE — Patient Instructions (Signed)
1. Contact dermatitis due to chemicals - We are going to do some patch testing on the week of June 5th at 1:30pm. - We will do readings on Wednesday June 7th at 1:30pm and Friday June 9th at 8:30am.  - You can take antihistamines during this time, but no prednisone.  - Bring in samples of your cosmetics to test as well.   2. Seasonal allergic rhinitis - Continue with an OTC antihistamine as needed. - I do not think that testing for environmental allergies is needed at this time.  - Please send results from your blood work to [place].   3. No follow-ups on file.    Please inform us of any Emergency Department visits, hospitalizations, or changes in symptoms. Call us before going to the ED for breathing or allergy symptoms since we might be able to fit you in for a sick visit. Feel free to contact us anytime with any questions, problems, or concerns.  It was a pleasure to meet you today!  Websites that have reliable patient information: 1. American Academy of Asthma, Allergy, and Immunology: www.aaaai.org 2. Food Allergy Research and Education (FARE): foodallergy.org 3. Mothers of Asthmatics: http://www.asthmacommunitynetwork.org 4. American College of Allergy, Asthma, and Immunology: www.acaai.org   COVID-19 Vaccine Information can be found at: ShippingScam.[email or web address].uk For questions related to vaccine distribution or appointments, please email [email or web address] or call [phone].   We realize that you might be concerned about having an allergic reaction to the COVID19 vaccines. To help with that concern, WE ARE OFFERING THE COVID19 VACCINES IN OUR OFFICE! Ask the front desk for dates!     "Like" [place] on Facebook and Instagram for our latest updates!      A healthy democracy works best when [employer] participate! Make sure you are registered to vote! If you have moved or changed any of your contact information, you will need to  get this updated before voting!  In some cases, you MAY be able to register to vote online: [email or web address]     True Test looks for the following sensitivities:

## 2022-03-28 NOTE — Addendum Note (Signed)
Addended by: Valentina Shaggy on: 03/28/2022 11:16 AM   Modules accepted: Orders

## 2022-04-07 ENCOUNTER — Encounter: Payer: Self-pay | Admitting: Allergy & Immunology

## 2022-04-07 ENCOUNTER — Ambulatory Visit: Payer: 59 | Admitting: Allergy & Immunology

## 2022-04-07 VITALS — BP 102/60 | HR 64 | Temp 97.8°F | Resp 14 | Ht 65.0 in | Wt 155.2 lb

## 2022-04-07 DIAGNOSIS — L253 Unspecified contact dermatitis due to other chemical products: Secondary | ICD-10-CM | POA: Diagnosis not present

## 2022-04-07 DIAGNOSIS — J302 Other seasonal allergic rhinitis: Secondary | ICD-10-CM | POA: Diagnosis not present

## 2022-04-07 MED ORDER — OPZELURA 1.5 % EX CREA: 1.0000 "application " | TOPICAL_CREAM | Freq: Two times a day (BID) | CUTANEOUS | 5 refills | Status: AC | PRN

## 2022-04-07 NOTE — Patient Instructions (Addendum)
1. Contact dermatitis due to chemicals - We are holding off on patch testing since this is not going to work with your schedule. - I agree with removing the fingernails to see if the nail polish is contributing to your eyelid dermatitis. - We are sending in Colony which is a nonsteroidal ointment that can be used on the face and does not burn like Eucrisa does. - Contact us with an update.  2. Seasonal allergic rhinitis - Continue with an OTC antihistamine as needed. - I do not think that testing for environmental allergies is needed at this time.   3. Return in about 6 months (around 10/07/2022).    Please inform us of any Emergency Department visits, hospitalizations, or changes in symptoms. Call us before going to the ED for breathing or allergy symptoms since we might be able to fit you in for a sick visit. Feel free to contact us anytime with any questions, problems, or concerns.  It was a pleasure to meet you today!  Websites that have reliable patient information: 1. American Academy of Asthma, Allergy, and Immunology: www.aaaai.org 2. Food Allergy Research and Education (FARE): foodallergy.org 3. Mothers of Asthmatics: http://www.asthmacommunitynetwork.org 4. American College of Allergy, Asthma, and Immunology: www.acaai.org   COVID-19 Vaccine Information can be found at: ShippingScam.[email or web address].uk For questions related to vaccine distribution or appointments, please email [email or web address] or call [phone].   We realize that you might be concerned about having an allergic reaction to the COVID19 vaccines. To help with that concern, WE ARE OFFERING THE COVID19 VACCINES IN OUR OFFICE! Ask the front desk for dates!     "Like" [place] on Facebook and Instagram for our latest updates!      A healthy democracy works best when [employer] participate! Make sure you are registered to vote! If you have moved or changed any of  your contact information, you will need to get this updated before voting!  In some cases, you MAY be able to register to vote online: [email or web address]     True Test looks for the following sensitivities:

## 2022-04-07 NOTE — Progress Notes (Signed)
FOLLOW UP  Date of Service/Encounter:  04/07/22   Assessment:   Contact dermatitis due to chemicals - likely from the nail polish  Seasonal allergic rhinitis  Plan/Recommendations:    1. Contact dermatitis due to chemicals - We are holding off on patch testing since this is not going to work with your schedule. - I agree with removing the fingernails to see if the nail polish is contributing to your eyelid dermatitis. - We are sending in [place] which is a nonsteroidal ointment that can be used on the face and does not burn like Eucrisa does. - Contact us with an update.  2. Seasonal allergic rhinitis - Continue with an OTC antihistamine as needed. - I do not think that testing for environmental allergies is needed at this time.   3. Return in about 6 months (around 10/07/2022).    Subjective:   Dawn Barton is a 56 y.o. female presenting today for follow up of  Chief Complaint  Patient presents with   Allergy Testing    Patch placement- possibly.     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  Dawn Barton is a 56 y.o. female presenting for a follow up visit.  She was last seen in May 2023 for evaluation of a rash on her face.  At that time, we did not do testing to environmental allergens since it did not seem that her symptoms warranted it.  She did have a little bit of seasonal allergic rhinitis, but it was not even anything that she takes medications  for.  We decided to do patch testing on her.  She presented today for a placement of a patch test, but she did not realize that she would not be able to shower for 48 hours and she declined to do the testing.  However, she reports that she stopped using make-up on her eye and is still having the itching.  She is going to get her nails removed in a few days, and she will let us know if this helps with her eyelid irritation.  She did try the Eucrisa at the last visit, but it had quite a bit of building pain, so she only used it once.  She is not sure if he did anything to help.  She does have a topical steroid which was given by an ophthalmologist that she uses very rarely.  I recommended that she not do that since steroids do thin the skin.  Otherwise, there have been no changes to her past medical history, surgical history, family history, or social history.    Review of Systems  Constitutional: Negative.  Negative for chills, fever, malaise/fatigue and weight loss.  HENT: Negative.  Negative for congestion, ear discharge, ear pain and sinus pain.   Eyes:  Negative for pain, discharge and redness.  Respiratory:  Negative for cough, sputum production, shortness of breath and wheezing.   Cardiovascular: Negative.  Negative for chest pain and palpitations.  Gastrointestinal:  Negative for  abdominal pain, constipation, diarrhea, heartburn, nausea and vomiting.  Skin:  Positive for itching and rash.  Neurological:  Negative for dizziness and headaches.  Endo/Heme/Allergies:  Negative for environmental allergies. Does not bruise/bleed easily.      Objective:   Blood pressure 102/60, pulse 64, temperature 97.8 F (36.6 C), temperature source Temporal, resp. rate 14, height '5\' 5"'$  (1.651 m), weight 155 lb 3.2 oz (70.4 kg), SpO2 97 %. Body mass index is 25.83 kg/m.    Physical Exam Vitals reviewed.  Constitutional:      Appearance: She is well-developed.     Comments: Very talkative.  Well  spoken.  HENT:     Head: Normocephalic and atraumatic.     Right Ear: Tympanic membrane, ear canal and external ear normal. No drainage, swelling or tenderness. Tympanic membrane is not injected, scarred, erythematous, retracted or bulging.     Left Ear: Tympanic membrane, ear canal and external ear normal. No drainage, swelling or tenderness. Tympanic membrane is not injected, scarred, erythematous, retracted or bulging.     Nose: Rhinorrhea present. No nasal deformity, septal deviation or mucosal edema.     Right Turbinates: Enlarged and swollen.     Left Turbinates: Enlarged and swollen.     Right Sinus: No maxillary sinus tenderness or frontal sinus tenderness.     Left Sinus: No maxillary sinus tenderness or frontal sinus tenderness.     Mouth/Throat:     Mouth: Mucous membranes are not pale and not dry.     Pharynx: Uvula midline.  Eyes:     General:        Right eye: No discharge.        Left eye: No discharge.     Conjunctiva/sclera: Conjunctivae normal.     Right eye: Right conjunctiva is not injected. No chemosis.    Left eye: Left conjunctiva is not injected. No chemosis.    Pupils: Pupils are equal, round, and reactive to light.     Comments: She does have erythematous eyelids bilaterally that are somewhat macerated with excoriations.  Cardiovascular:     Rate and Rhythm: Normal rate and regular rhythm.     Heart sounds: Normal heart sounds.  Pulmonary:     Effort: Pulmonary effort is normal. No tachypnea, accessory muscle usage or respiratory distress.     Breath sounds: Normal breath sounds. No wheezing, rhonchi or rales.  Chest:     Chest wall: No tenderness.  Lymphadenopathy:     Head:     Right side of head: No submandibular, tonsillar or occipital adenopathy.     Left side of head: No submandibular, tonsillar or occipital adenopathy.     Cervical: No cervical adenopathy.  Skin:    General: Skin is warm.     Capillary Refill: Capillary refill takes less than 2  seconds.     Coloration: Skin is not pale.     Findings: Rash present. No abrasion, erythema or petechiae. Rash is not papular, urticarial or vesicular.     Comments: She does have an erythematous periorbital rash around the right eye.  It is rather macerated.  No oozing or crusting noted.  Neurological:     Mental Status: She is alert.  Psychiatric:        Behavior: Behavior is cooperative.     Diagnostic studies: none      Salvatore Marvel, MD  [facility]

## 2022-04-09 ENCOUNTER — Ambulatory Visit: Payer: 59 | Admitting: Family

## 2022-04-09 ENCOUNTER — Other Ambulatory Visit (HOSPITAL_COMMUNITY): Payer: Self-pay | Admitting: Family Medicine

## 2022-04-11 ENCOUNTER — Ambulatory Visit: Payer: 59 | Admitting: Allergy & Immunology

## 2022-04-15 ENCOUNTER — Ambulatory Visit: Payer: 59 | Admitting: Allergy

## 2022-04-15 ENCOUNTER — Telehealth: Payer: Self-pay | Admitting: Allergy & Immunology

## 2022-04-15 NOTE — Telephone Encounter (Signed)
From: Salvatore Marvel '@[email or web address]> Sent: Tuesday, April 15, 2022 5:12 PM To: Anselm Pancoast '@[email or web address]> Subject: FW: cream   I think she is talking about Opzeulra.

## 2022-04-15 NOTE — Telephone Encounter (Signed)
I have called patient to asked additional questions because I was confused in what she is asking for.  Patient stated that she did not hear anything from [place] or the specialty pharmacy.   She went into detail about itchy eyes. Suggest that she come to the [place] office and get sample to try first.  Patient agrees and I have left a sample of [place] for patient.

## 2022-04-15 NOTE — Telephone Encounter (Signed)
From: Dawn Barton '@[email or web address]> Sent: Tuesday, April 15, 2022 9:58 AM To: Salvatore Marvel '@[email or web address]> Subject: cream     Good morning,     I am following up on the medication you prescribe for my eyes on June 5th. The MA stated that someone would call me to confirm my insurance. She was unsure of who or where the medication was being filled. Just wanted to follow up my eyes are itching, and I would like to try to see if the cream would help.     Thanks,    Dawn Barton

## 2022-04-17 ENCOUNTER — Other Ambulatory Visit (HOSPITAL_COMMUNITY): Payer: Self-pay

## 2022-05-20 ENCOUNTER — Ambulatory Visit: Payer: 59 | Admitting: Nurse Practitioner

## 2022-05-20 ENCOUNTER — Encounter: Payer: Self-pay | Admitting: Nurse Practitioner

## 2022-05-20 VITALS — BP 123/75 | HR 55 | Temp 97.4°F | Ht 65.0 in | Wt 155.2 lb

## 2022-05-20 DIAGNOSIS — E7849 Other hyperlipidemia: Secondary | ICD-10-CM | POA: Diagnosis not present

## 2022-05-20 DIAGNOSIS — Z859 Personal history of malignant neoplasm, unspecified: Secondary | ICD-10-CM

## 2022-05-20 DIAGNOSIS — E038 Other specified hypothyroidism: Secondary | ICD-10-CM | POA: Diagnosis not present

## 2022-05-20 DIAGNOSIS — Z131 Encounter for screening for diabetes mellitus: Secondary | ICD-10-CM | POA: Diagnosis not present

## 2022-05-20 DIAGNOSIS — E782 Mixed hyperlipidemia: Secondary | ICD-10-CM | POA: Diagnosis not present

## 2022-05-20 NOTE — Progress Notes (Signed)
   Subjective:    Patient ID: Dawn Barton, female    DOB: 07/28/1966, 56 y.o.   MRN: [id number]  HPI  56 year old with history hypothyroidism and breast cancer presents to clinic to left to establish care.  Patient is currently on 112 mcg of levothyroxine.  And is tolerating medication without difficulty.  Patient states that she lost 25 pounds on Mounjaro over the past year.  Patient states that she has since stopped taking Mounjaro and is concerned about rebound weight gain.  Patient states that she works 12-hour shifts but tries to work out whenever possible.  Patient states that she is very active and also eats a well-balanced diet.  Patient has no other complaints today.  Patient would like to have updated labs done.  Patient states that she has family history of diabetes.   Review of Systems  All other systems reviewed and are negative.      Objective:   Physical Exam Vitals reviewed.  Constitutional:      General: She is not in acute distress.    Appearance: Normal appearance. She is normal weight. She is not ill-appearing, toxic-appearing or diaphoretic.  HENT:     Head: Normocephalic and atraumatic.  Cardiovascular:     Rate and Rhythm: Normal rate and regular rhythm.     Pulses: Normal pulses.     Heart sounds: Normal heart sounds. No murmur heard. Pulmonary:     Effort: Pulmonary effort is normal. No respiratory distress.     Breath sounds: Normal breath sounds. No wheezing.  Musculoskeletal:     Comments: Grossly intact  Skin:    General: Skin is warm.     Capillary Refill: Capillary refill takes less than 2 seconds.  Neurological:     Mental Status: She is alert.     Comments: Grossly intact  Psychiatric:        Mood and Affect: Mood normal.        Behavior: Behavior normal.           Assessment & Plan:   1. Other specified hypothyroidism -We will get updated TSH to see if any adjustments to her levothyroxine is needed. -Patient has lost 25  pounds therefore adjustments may be needed.  Awaiting results of lab - TSH + free T4 -Return to clinic in 6 months or possibly sooner depending on TSH lab results.  2. Diabetes mellitus screening -Patient has family history of diabetes that include her mother, brother and daughter who has type 1 diabetes. -We will screen patient today with A1c. - Hemoglobin A1c  3. Mixed hyperlipidemia -Patient self-reports history of hyperlipidemia.  Will evaluate cholesterol level with lipid profile. - Lipid Profile - CMP14+EGFR  4. History of cancer -Patient has history of breast cancer.  No other concerns today. -We will check CBC for anemias or leukocytosis. - CBC with Differential    Note:  This document was prepared using Dragon voice recognition software and may include unintentional dictation errors. Note - This record has been created using [employer].  Chart creation errors have been sought, but may not always  have been located. Such creation errors do not reflect on  the standard of medical care.

## 2022-05-20 NOTE — Addendum Note (Signed)
Addended by: Claire Shown on: 05/20/2022 10:34 AM   Modules accepted: Level of Service

## 2022-05-20 NOTE — Progress Notes (Signed)
Establish care,no concerns wants lab work

## 2022-05-21 ENCOUNTER — Encounter: Payer: Self-pay | Admitting: Nurse Practitioner

## 2022-05-21 LAB — CBC WITH DIFFERENTIAL/PLATELET
Basophils Absolute: 0 10*3/uL (ref 0.0–0.2)
Basos: 1 %
EOS (ABSOLUTE): 0.1 10*3/uL (ref 0.0–0.4)
Eos: 2 %
Hematocrit: 40.5 % (ref 34.0–46.6)
Hemoglobin: 13.8 g/dL (ref 11.1–15.9)
Immature Grans (Abs): 0 10*3/uL (ref 0.0–0.1)
Immature Granulocytes: 0 %
Lymphocytes Absolute: 1.6 10*3/uL (ref 0.7–3.1)
Lymphs: 32 %
MCH: 29.1 pg (ref 26.6–33.0)
MCHC: 34.1 g/dL (ref 31.5–35.7)
MCV: 85 fL (ref 79–97)
Monocytes Absolute: 0.5 10*3/uL (ref 0.1–0.9)
Monocytes: 11 %
Neutrophils Absolute: 2.6 10*3/uL (ref 1.4–7.0)
Neutrophils: 54 %
Platelets: 237 10*3/uL (ref 150–450)
RBC: 4.75 x10E6/uL (ref 3.77–5.28)
RDW: 13.5 % (ref 11.7–15.4)
WBC: 4.9 10*3/uL (ref 3.4–10.8)

## 2022-05-21 LAB — HEMOGLOBIN A1C
Est. average glucose Bld gHb Est-mCnc: 103 mg/dL
Hgb A1c MFr Bld: 5.2 % (ref 4.8–5.6)

## 2022-05-21 LAB — CMP14+EGFR
ALT: 18 IU/L (ref 0–32)
AST: 17 IU/L (ref 0–40)
Albumin/Globulin Ratio: 1.9 (ref 1.2–2.2)
Albumin: 4.7 g/dL (ref 3.8–4.9)
Alkaline Phosphatase: 51 IU/L (ref 44–121)
BUN/Creatinine Ratio: 17 (ref 9–23)
BUN: 13 mg/dL (ref 6–24)
Bilirubin Total: 0.5 mg/dL (ref 0.0–1.2)
CO2: 27 mmol/L (ref 20–29)
Calcium: 10 mg/dL (ref 8.7–10.2)
Chloride: 99 mmol/L (ref 96–106)
Creatinine, Ser: 0.77 mg/dL (ref 0.57–1.00)
Globulin, Total: 2.5 g/dL (ref 1.5–4.5)
Glucose: 91 mg/dL (ref 70–99)
Potassium: 3.9 mmol/L (ref 3.5–5.2)
Sodium: 137 mmol/L (ref 134–144)
Total Protein: 7.2 g/dL (ref 6.0–8.5)
eGFR: 90 mL/min/{1.73_m2} (ref >59)

## 2022-05-21 LAB — TSH+FREE T4
Free T4: 1.44 ng/dL (ref 0.82–1.77)
TSH: 2.07 u[IU]/mL (ref 0.450–4.500)

## 2022-05-21 LAB — LIPID PANEL
Chol/HDL Ratio: 2.8 ratio (ref 0.0–4.4)
Cholesterol, Total: 217 mg/dL — ABNORMAL HIGH (ref 100–199)
HDL: 78 mg/dL (ref >39)
LDL Chol Calc (NIH): 127 mg/dL — ABNORMAL HIGH (ref 0–99)
Triglycerides: 70 mg/dL (ref 0–149)
VLDL Cholesterol Cal: 12 mg/dL (ref 5–40)

## 2022-05-28 ENCOUNTER — Other Ambulatory Visit (HOSPITAL_COMMUNITY): Payer: Self-pay

## 2022-06-05 ENCOUNTER — Other Ambulatory Visit (HOSPITAL_COMMUNITY): Payer: Self-pay | Admitting: Nurse Practitioner

## 2022-06-05 DIAGNOSIS — Z1231 Encounter for screening mammogram for malignant neoplasm of breast: Secondary | ICD-10-CM

## 2022-06-11 ENCOUNTER — Ambulatory Visit (HOSPITAL_COMMUNITY)
Admission: RE | Admit: 2022-06-11 | Discharge: 2022-06-11 | Disposition: A | Discharge status: Home / Self Care | Payer: 59 | Source: Ambulatory Visit | Attending: Nurse Practitioner | Admitting: Nurse Practitioner

## 2022-06-11 DIAGNOSIS — Z1231 Encounter for screening mammogram for malignant neoplasm of breast: Secondary | ICD-10-CM | POA: Insufficient documentation

## 2022-10-29 ENCOUNTER — Other Ambulatory Visit: Payer: Self-pay | Admitting: Family Medicine

## 2022-10-29 DIAGNOSIS — E038 Other specified hypothyroidism: Secondary | ICD-10-CM

## 2022-10-30 MED ORDER — LEVOTHYROXINE SODIUM 112 MCG PO TABS
112.0000 ug | ORAL_TABLET | Freq: Every day | ORAL | 0 refills | Status: DC
  Filled 2022-10-30: qty 90, 90d supply, fill #0

## 2022-10-30 MED ORDER — CITALOPRAM HYDROBROMIDE 10 MG PO TABS
10.0000 mg | ORAL_TABLET | Freq: Every day | ORAL | 0 refills | Status: DC
  Filled 2022-10-30: qty 90, 90d supply, fill #0

## 2022-10-31 ENCOUNTER — Other Ambulatory Visit: Payer: Self-pay

## 2022-10-31 ENCOUNTER — Other Ambulatory Visit (HOSPITAL_COMMUNITY): Payer: Self-pay

## 2022-11-20 ENCOUNTER — Ambulatory Visit: Payer: 59 | Admitting: Nurse Practitioner

## 2022-11-20 ENCOUNTER — Ambulatory Visit (INDEPENDENT_AMBULATORY_CARE_PROVIDER_SITE_OTHER): Payer: 59 | Admitting: Family Medicine

## 2022-11-20 ENCOUNTER — Other Ambulatory Visit (HOSPITAL_COMMUNITY): Payer: Self-pay

## 2022-11-20 DIAGNOSIS — E663 Overweight: Secondary | ICD-10-CM | POA: Diagnosis not present

## 2022-11-20 DIAGNOSIS — E785 Hyperlipidemia, unspecified: Secondary | ICD-10-CM | POA: Insufficient documentation

## 2022-11-20 DIAGNOSIS — E7800 Pure hypercholesterolemia, unspecified: Secondary | ICD-10-CM | POA: Diagnosis not present

## 2022-11-20 DIAGNOSIS — E038 Other specified hypothyroidism: Secondary | ICD-10-CM

## 2022-11-20 DIAGNOSIS — Z86000 Personal history of in-situ neoplasm of breast: Secondary | ICD-10-CM | POA: Diagnosis not present

## 2022-11-20 DIAGNOSIS — Z130 Encounter for screening for diseases of the blood and blood-forming organs and certain disorders involving the immune mechanism: Secondary | ICD-10-CM

## 2022-11-20 MED ORDER — CITALOPRAM HYDROBROMIDE 20 MG PO TABS
20.0000 mg | ORAL_TABLET | Freq: Every day | ORAL | 3 refills | Status: DC
  Filled 2022-11-20: qty 90, 90d supply, fill #0
  Filled 2023-02-25: qty 90, 90d supply, fill #1
  Filled 2023-06-01: qty 90, 90d supply, fill #2
  Filled 2023-08-26 – 2023-08-27 (×2): qty 90, 90d supply, fill #3

## 2022-11-20 NOTE — Patient Instructions (Addendum)
Labs at your convenience.   Last TSH was 2.070.  I fixed the Celexa.  Follow up in 6 months.

## 2022-11-20 NOTE — Assessment & Plan Note (Signed)
Patient wants to continue with lifestyle changes.  Lipid panel ordered.

## 2022-11-20 NOTE — Progress Notes (Signed)
Subjective:  Patient ID: Dawn Barton, female    DOB: 1966/07/18  Age: 57 y.o. MRN: [id number]  CC: Chief Complaint  Patient presents with   6 month follow up     Check labs for everything including cholesterol    discuss medication    Citalopram dose was changed to 10 mg from 20 mg   Nasal Congestion    X 4 days , no fever, negative covid test,taken nyquil, elderberry    HPI:  57 year old female with a history of DCIS, hypothyroidism, hyperlipidemia presents for follow-up.  Patient would like her labs to be ordered.  Patient reports recent URI.  Negative COVID testing.  Doing okay at this time.  Dose of her Celexa was inadvertently changed.  She would like to have it changed back to 20 mg daily.  Patient's hypothyroidism has been stable on levothyroxine 112 mcg.  Needs TSH.  Discussed hyperlipidemia today.  Patient wishes to continue dietary/lifestyle changes.  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    Employer: [employer]  Tobacco Use   Smoking status: Never   Smokeless tobacco: Never  Vaping Use   Vaping Use: Never used  Substance and Sexual Activity   Alcohol use: Yes    Comment: social drinker   Drug use: No   Sexual activity: Yes    Birth control/protection: Post-menopausal, Surgical    Comment: Tubal ligation  Other Topics Concern   Not on file  Social History Narrative   Not on file   Social Determinants of Health   Financial Resource Strain: Not on file  Food  Insecurity: Not on file  Transportation Needs: Not on file  Physical Activity: Not on file  Stress: Not on file  Social Connections: Not on file    Review of Systems Per HPI  Objective:  BP 110/70   Pulse 68   Temp 98.4 F (36.9 C)   Ht '5\' 5"'$  (1.651 m)   Wt 166 lb (75.3 kg)   SpO2 97%   BMI 27.62 kg/m      11/20/2022   10:21 AM 05/20/2022    9:08 AM 04/07/2022    1:48 PM  BP/Weight  Systolic BP 397 673 419  Diastolic BP 70 75 60  Wt. (Lbs) 166 155.2 155.2  BMI 27.62 kg/m2 25.83 kg/m2 25.83 kg/m2    Physical Exam Vitals and nursing note reviewed.  Constitutional:      General: She is not in acute distress.    Appearance: Normal appearance.  HENT:     Head: Normocephalic and atraumatic.  Eyes:     General:        Right eye: No discharge.        Left eye: No discharge.     Conjunctiva/sclera: Conjunctivae normal.  Cardiovascular:     Rate and Rhythm: Normal rate and regular rhythm.  Pulmonary:     Effort: Pulmonary effort is normal.     Breath sounds:  Normal breath sounds. No wheezing or rales.  Neurological:     Mental Status: She is alert.  Psychiatric:        Mood and Affect: Mood normal.        Behavior: Behavior normal.     Lab Results  Component Value Date   WBC 4.9 05/20/2022   HGB 13.8 05/20/2022   HCT 40.5 05/20/2022   PLT 237 05/20/2022   GLUCOSE 91 05/20/2022   CHOL 217 (H) 05/20/2022   TRIG 70 05/20/2022   HDL 78 05/20/2022   LDLCALC 127 (H) 05/20/2022   ALT 18 05/20/2022   AST 17 05/20/2022   NA 137 05/20/2022   K 3.9 05/20/2022   CL 99 05/20/2022   CREATININE 0.77 05/20/2022   BUN 13 05/20/2022   CO2 27 05/20/2022   TSH 2.070 05/20/2022   HGBA1C 5.2 05/20/2022     Assessment & Plan:   Problem List Items Addressed This Visit       Endocrine   Hypothyroidism    TSH ordered.  Continue levothyroxine 112 mcg.      Relevant Orders   TSH     Other   History of ductal carcinoma in situ (DCIS) of breast   Hyperlipidemia     Patient wants to continue with lifestyle changes.  Lipid panel ordered.      Relevant Orders   Lipid panel   Overweight (BMI 25.0-29.9)   Relevant Orders   CMP14+EGFR   Other Visit Diagnoses     Screening for deficiency anemia       Relevant Orders   CBC       Meds ordered this encounter  Medications   citalopram (CELEXA) 20 MG tablet    Sig: Take 1 tablet (20 mg total) by mouth daily.    Dispense:  90 tablet    Refill:  3    Follow-up:   6 months  [place]

## 2022-11-20 NOTE — Assessment & Plan Note (Signed)
TSH ordered.  Continue levothyroxine 112 mcg.

## 2022-11-21 ENCOUNTER — Telehealth: Payer: 59 | Admitting: Family Medicine

## 2022-11-21 DIAGNOSIS — B9689 Other specified bacterial agents as the cause of diseases classified elsewhere: Secondary | ICD-10-CM

## 2022-11-21 DIAGNOSIS — J019 Acute sinusitis, unspecified: Secondary | ICD-10-CM

## 2022-11-21 MED ORDER — AMOXICILLIN-POT CLAVULANATE 875-125 MG PO TABS: 1.0000 | ORAL_TABLET | Freq: Two times a day (BID) | ORAL | 0 refills | Status: AC

## 2022-11-21 NOTE — Progress Notes (Signed)

## 2022-11-25 LAB — CMP14+EGFR
ALT: 18 IU/L (ref 0–32)
AST: 16 IU/L (ref 0–40)
Albumin/Globulin Ratio: 1.8 (ref 1.2–2.2)
Albumin: 4.6 g/dL (ref 3.8–4.9)
Alkaline Phosphatase: 74 IU/L (ref 44–121)
BUN/Creatinine Ratio: 15 (ref 9–23)
BUN: 11 mg/dL (ref 6–24)
Bilirubin Total: 0.5 mg/dL (ref 0.0–1.2)
CO2: 24 mmol/L (ref 20–29)
Calcium: 9.8 mg/dL (ref 8.7–10.2)
Chloride: 98 mmol/L (ref 96–106)
Creatinine, Ser: 0.71 mg/dL (ref 0.57–1.00)
Globulin, Total: 2.6 g/dL (ref 1.5–4.5)
Glucose: 97 mg/dL (ref 70–99)
Potassium: 4.3 mmol/L (ref 3.5–5.2)
Sodium: 139 mmol/L (ref 134–144)
Total Protein: 7.2 g/dL (ref 6.0–8.5)
eGFR: 99 mL/min/{1.73_m2} (ref >59)

## 2022-11-25 LAB — TSH: TSH: 4.76 u[IU]/mL — ABNORMAL HIGH (ref 0.450–4.500)

## 2022-11-25 LAB — CBC
Hematocrit: 42.5 % (ref 34.0–46.6)
Hemoglobin: 14.1 g/dL (ref 11.1–15.9)
MCH: 28.4 pg (ref 26.6–33.0)
MCHC: 33.2 g/dL (ref 31.5–35.7)
MCV: 86 fL (ref 79–97)
Platelets: 236 10*3/uL (ref 150–450)
RBC: 4.96 x10E6/uL (ref 3.77–5.28)
RDW: 14.2 % (ref 11.7–15.4)
WBC: 8.8 10*3/uL (ref 3.4–10.8)

## 2022-11-25 LAB — LIPID PANEL
Chol/HDL Ratio: 3.2 ratio (ref 0.0–4.4)
Cholesterol, Total: 238 mg/dL — ABNORMAL HIGH (ref 100–199)
HDL: 75 mg/dL (ref >39)
LDL Chol Calc (NIH): 144 mg/dL — ABNORMAL HIGH (ref 0–99)
Triglycerides: 112 mg/dL (ref 0–149)
VLDL Cholesterol Cal: 19 mg/dL (ref 5–40)

## 2022-12-01 ENCOUNTER — Other Ambulatory Visit: Payer: Self-pay

## 2022-12-01 ENCOUNTER — Other Ambulatory Visit: Payer: Self-pay | Admitting: Family Medicine

## 2022-12-01 MED ORDER — LEVOTHYROXINE SODIUM 125 MCG PO TABS
125.0000 ug | ORAL_TABLET | Freq: Every day | ORAL | 0 refills | Status: DC
  Filled 2022-12-01: qty 90, 90d supply, fill #0

## 2022-12-01 MED ORDER — ROSUVASTATIN CALCIUM 10 MG PO TABS
10.0000 mg | ORAL_TABLET | Freq: Every day | ORAL | 3 refills | Status: DC
  Filled 2022-12-01: qty 90, 90d supply, fill #0

## 2022-12-01 NOTE — Addendum Note (Signed)
Addended by: Dairl Ponder on: 12/01/2022 01:29 PM   Modules accepted: Orders

## 2022-12-19 ENCOUNTER — Telehealth: Payer: Self-pay | Admitting: Family Medicine

## 2022-12-19 DIAGNOSIS — Z86000 Personal history of in-situ neoplasm of breast: Secondary | ICD-10-CM

## 2022-12-19 NOTE — Telephone Encounter (Signed)
Patient is needing a referral sent to GYN Dr. Garwin Brothers she is wanting her records for review for appointment patient has setup. Fax- referral to : [phone]

## 2022-12-19 NOTE — Telephone Encounter (Signed)
Coral Spikes, DO     Please place referral

## 2022-12-19 NOTE — Telephone Encounter (Signed)
Referral ordered in EPIC. 

## 2023-02-25 ENCOUNTER — Other Ambulatory Visit: Payer: Self-pay | Admitting: Family Medicine

## 2023-02-26 ENCOUNTER — Other Ambulatory Visit: Payer: Self-pay

## 2023-02-26 ENCOUNTER — Other Ambulatory Visit (HOSPITAL_COMMUNITY): Payer: Self-pay

## 2023-02-26 MED ORDER — LEVOTHYROXINE SODIUM 125 MCG PO TABS
125.0000 ug | ORAL_TABLET | Freq: Every day | ORAL | 1 refills | Status: DC
  Filled 2023-02-26: qty 90, 90d supply, fill #0
  Filled 2023-06-01: qty 90, 90d supply, fill #1

## 2023-03-25 DIAGNOSIS — N952 Postmenopausal atrophic vaginitis: Secondary | ICD-10-CM | POA: Diagnosis not present

## 2023-03-25 DIAGNOSIS — Z853 Personal history of malignant neoplasm of breast: Secondary | ICD-10-CM | POA: Diagnosis not present

## 2023-03-25 DIAGNOSIS — E039 Hypothyroidism, unspecified: Secondary | ICD-10-CM | POA: Diagnosis not present

## 2023-03-25 DIAGNOSIS — Z01419 Encounter for gynecological examination (general) (routine) without abnormal findings: Secondary | ICD-10-CM | POA: Diagnosis not present

## 2023-04-02 ENCOUNTER — Other Ambulatory Visit: Payer: Self-pay | Admitting: Obstetrics and Gynecology

## 2023-04-02 DIAGNOSIS — Z780 Asymptomatic menopausal state: Secondary | ICD-10-CM

## 2023-04-02 DIAGNOSIS — Z1231 Encounter for screening mammogram for malignant neoplasm of breast: Secondary | ICD-10-CM

## 2023-04-07 ENCOUNTER — Other Ambulatory Visit (HOSPITAL_COMMUNITY): Payer: Self-pay

## 2023-04-07 DIAGNOSIS — E7800 Pure hypercholesterolemia, unspecified: Secondary | ICD-10-CM

## 2023-04-16 ENCOUNTER — Telehealth: Payer: 59 | Admitting: Physician Assistant

## 2023-04-16 DIAGNOSIS — K648 Other hemorrhoids: Secondary | ICD-10-CM | POA: Diagnosis not present

## 2023-04-17 MED ORDER — HYDROCORTISONE 2.5 % EX CREA: TOPICAL_CREAM | Freq: Two times a day (BID) | CUTANEOUS | 0 refills | Status: DC

## 2023-04-17 NOTE — Progress Notes (Signed)
I have spent 5 minutes in review of e-visit questionnaire, review and updating patient chart, medical decision making and response to patient.   Robet Crutchfield Cody Renato Spellman, PA-C    

## 2023-04-17 NOTE — Progress Notes (Signed)
E-Visit for Hemorrhoid  We are sorry that you are not feeling well. We are here to help!  Hemorrhoids are swollen veins in the rectum. They can cause itching, bleeding, and pain. Hemorrhoids are very common.  In some cases, you can see or feel hemorrhoids around the outside of the rectum. In other cases, you cannot see them because they are hidden inside the rectum. Be patient - It can take months for this to improve or go away.   Hemorrhoids do not always cause symptoms. But when they do, symptoms can include: ?Itching of the skin around the anus ?Bleeding - Bleeding is usually painless. You might see bright red blood after using the toilet. ?Pain - If a blood clot forms inside a hemorrhoid, this can cause pain. It can also cause a lump that you might be able to feel.   What can I do to keep from getting more hemorrhoids? -- The most important thing you can do is to keep from getting constipated. You should have a bowel movement at least a few times a week. When you have a bowel movement, you also should not have to push too much. Plus, your bowel movements should not be too hard. Being constipated and having hard bowel movements can make hemorrhoids worse.   I have prescribed Topical Hydrocortisone ointment 2.5%.  Apply to area two times per day for 30 days  HOME CARE: Sitz Baths twice daily. Soak buttocks in 2 or 3 inches of warm water for 10 to 15 minutes. Do not add soap, bubble bath, or anything to the water. Stool softener such as Colace 100 mg twice daily AND Miralax 1 scoop daily until you have regular soft stools Over the counter Preparation H Tucks Pads Witch Hazel  Here are some steps you can take to avoid getting constipated or having hard stools:  ?Eat lots of fruits, vegetables, and other foods with fiber. Fiber helps to increase bowel movements. If you do not get enough fiber from your diet, you can take fiber supplements. These come in the form of powders, wafers, or  pills. Some examples are Metamucil, Citrucel, Benefiber and FiberCon. If you take a fiber supplement, be sure to read the label so you know how much to take. If you're not sure, ask your provider or nurse. ?Take medicines called "stool softeners" such as docusate sodium (sample brand names: Colace, Dulcolax). These medicines increase the number of bowel movements you have. They are safe to take and they can prevent problems later.  You should request a referral for a follow up evaluation with a Gastroenterologist (GI doctor) to evaluate this chronic and relapsing condition - even if it improves to see what further steps need to be taken. This is highly linked to chronic constipation and straining to have a bowel movement. It may require further treatment or surgical intervention.   GET HELP RIGHT AWAY IF: You develop severe pain You have heavy bleeding   FOLLOW UP WITH YOUR PRIMARY PROVIDER IF: If your symptoms do not improve within 10 days  MAKE SURE YOU  Understand these instructions. Will watch your condition. Will get help right away if you are not doing well or get worse.   Thank you for choosing an e-visit.  Your e-visit answers were reviewed by a board certified advanced clinical practitioner to complete your personal care plan. Depending upon the condition, your plan could have included both over the counter or prescription medications.  Please review your pharmacy choice. Make   sure the pharmacy is open so you can pick up prescription now. If there is a problem, you may contact your provider through MyChart messaging and have the prescription routed to another pharmacy.  Your safety is important to us. If you have drug allergies check your prescription carefully.   For the next 24 hours you can use MyChart to ask questions about today's visit, request a non-urgent call back, or ask for a work or school excuse. You will get an email in the next two days asking about your experience.  I hope that your e-visit has been valuable and will speed your recovery.  

## 2023-04-23 ENCOUNTER — Ambulatory Visit (HOSPITAL_COMMUNITY)
Admission: RE | Admit: 2023-04-23 | Discharge: 2023-04-23 | Disposition: A | Discharge status: Home / Self Care | Payer: 59 | Source: Ambulatory Visit | Attending: Internal Medicine | Admitting: Internal Medicine

## 2023-04-23 DIAGNOSIS — E7800 Pure hypercholesterolemia, unspecified: Secondary | ICD-10-CM | POA: Insufficient documentation

## 2023-05-01 ENCOUNTER — Telehealth: Payer: 59 | Admitting: Physician Assistant

## 2023-05-01 DIAGNOSIS — M545 Low back pain, unspecified: Secondary | ICD-10-CM | POA: Diagnosis not present

## 2023-05-01 MED ORDER — BACLOFEN 10 MG PO TABS
10.0000 mg | ORAL_TABLET | Freq: Every evening | ORAL | 0 refills | Status: DC | PRN
Start: 2023-05-01 — End: 2023-06-11

## 2023-05-01 MED ORDER — NAPROXEN 500 MG PO TABS
500.0000 mg | ORAL_TABLET | Freq: Two times a day (BID) | ORAL | 0 refills | Status: DC
Start: 2023-05-01 — End: 2023-07-16

## 2023-05-01 NOTE — Progress Notes (Signed)
We are sorry that you are not feeling well.  Here is how we plan to help!  Based on what you have shared with me it looks like you mostly have acute back pain.  Acute back pain is defined as musculoskeletal pain that can resolve in 1-3 weeks with conservative treatment.  I have prescribed Naprosyn 500 mg take one by mouth twice a day non-steroid anti-inflammatory (NSAID) as well as Baclofen 10 mg to use in the evening as needed which is a muscle relaxer (can be used up to three times daily but even though less sedating than other relaxants, it still can happen) Some patients experience stomach irritation or in increased heartburn with anti-inflammatory drugs.  Please keep in mind that muscle relaxer's can cause fatigue and should not be taken while at work or driving.  Back pain is very common.  The pain often gets better over time.  The cause of back pain is usually not dangerous.  Most people can learn to manage their back pain on their own.  Home Care Stay active.  Start with short walks on flat ground if you can.  Try to walk farther each day. Do not sit, drive or stand in one place for more than 30 minutes.  Do not stay in bed. Do not avoid exercise or work.  Activity can help your back heal faster. Be careful when you bend or lift an object.  Bend at your knees, keep the object close to you, and do not twist. Sleep on a firm mattress.  Lie on your side, and bend your knees.  If you lie on your back, put a pillow under your knees. Only take medicines as told by your doctor. Put ice on the injured area. Put ice in a plastic bag Place a towel between your skin and the bag Leave the ice on for 15-20 minutes, 3-4 times a day for the first 2-3 days. 210 After that, you can switch between ice and heat packs. Ask your doctor about back exercises or massage. Avoid feeling anxious or stressed.  Find good ways to deal with stress, such as exercise.  Get Help Right Way If: Your pain does not go away  with rest or medicine. Your pain does not go away in 1 week. You have new problems. You do not feel well. The pain spreads into your legs. You cannot control when you poop (bowel movement) or pee (urinate) You feel sick to your stomach (nauseous) or throw up (vomit) You have belly (abdominal) pain. You feel like you may pass out (faint). If you develop a fever.  Make Sure you: Understand these instructions. Will watch your condition Will get help right away if you are not doing well or get worse.  Your e-visit answers were reviewed by a board certified advanced clinical practitioner to complete your personal care plan.  Depending on the condition, your plan could have included both over the counter or prescription medications.  If there is a problem please reply  once you have received a response from your provider.  Your safety is important to [place].  If you have drug allergies check your prescription carefully.    You can use MyChart to ask questions about today's visit, request a non-urgent call back, or ask for a work or school excuse for 24 hours related to this e-Visit. If it has been greater than 24 hours you will need to follow up with your provider, or enter a new e-Visit to address  those concerns.  You will get an e-mail in the next two days asking about your experience.  I hope that your e-visit has been valuable and will speed your recovery. Thank you for using e-visits.  

## 2023-05-01 NOTE — Progress Notes (Signed)
I have spent 5 minutes in review of e-visit questionnaire, review and updating patient chart, medical decision making and response to patient.   Lanah Steines Cody Ophie Burrowes, PA-C    

## 2023-05-04 ENCOUNTER — Telehealth (HOSPITAL_COMMUNITY): Payer: Self-pay

## 2023-05-04 NOTE — Telephone Encounter (Addendum)
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

## 2023-05-12 ENCOUNTER — Encounter (HOSPITAL_COMMUNITY): Payer: Self-pay | Admitting: Family Medicine

## 2023-05-12 DIAGNOSIS — Z1231 Encounter for screening mammogram for malignant neoplasm of breast: Secondary | ICD-10-CM

## 2023-06-02 ENCOUNTER — Other Ambulatory Visit (HOSPITAL_COMMUNITY): Payer: Self-pay

## 2023-06-11 ENCOUNTER — Encounter: Payer: Self-pay | Admitting: Emergency Medicine

## 2023-06-11 ENCOUNTER — Ambulatory Visit
Admission: EM | Admit: 2023-06-11 | Discharge: 2023-06-11 | Disposition: A | Discharge status: Home / Self Care | Payer: 59 | Attending: Nurse Practitioner | Admitting: Nurse Practitioner

## 2023-06-11 DIAGNOSIS — R112 Nausea with vomiting, unspecified: Secondary | ICD-10-CM | POA: Diagnosis not present

## 2023-06-11 LAB — POCT URINALYSIS DIP (MANUAL ENTRY)
Bilirubin, UA: NEGATIVE
Glucose, UA: NEGATIVE mg/dL
Nitrite, UA: NEGATIVE
Protein Ur, POC: NEGATIVE mg/dL
Spec Grav, UA: 1.015 (ref 1.010–1.025)
Urobilinogen, UA: 0.2 E.U./dL
pH, UA: 7 (ref 5.0–8.0)

## 2023-06-11 MED ORDER — ONDANSETRON HCL 4 MG PO TABS: 4.0000 mg | ORAL_TABLET | Freq: Three times a day (TID) | ORAL | 0 refills | Status: AC | PRN

## 2023-06-11 MED ORDER — ONDANSETRON 4 MG PO TBDP
4.0000 mg | ORAL_TABLET | Freq: Once | ORAL | Status: AC
  Administered 2023-06-11: 4 mg via ORAL

## 2023-06-11 NOTE — ED Provider Notes (Signed)
[facility]    CSN: [id number] Arrival date & time: 06/11/23  1717      History   Chief Complaint No chief complaint on file.   HPI Dawn Barton is a 57 y.o. female.   The history is provided by the patient.   The patient presents with a 2-day history of nausea and vomiting.  Patient states symptoms started after she ate some chicken nuggets.  Patient states that she has been vomiting continuously since that time, approximately every 2 hours.  She states her last episode of vomiting was around 5 AM this morning.  She states she has been drinking liquid IV and ginger ale.  Patient reports that she would become diaphoretic and hot with the vomiting episodes.  She believes she may have had a fever.  She denies upper respiratory symptoms, abdominal pain, gas, bloating, constipation, bloody stools, or urinary symptoms.  Patient states that she took a home COVID test which was negative.  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,er/prpositive   Complication of anesthesia    slow to wake up   Dizziness    Fatigue    Hypothyroidism    Major depressive disorder, recurrent episode, mild (HCC)    Mixed hyperlipidemia    Radiation 11/12/2011-12/26/2011   5040 cGy   Vertigo, benign paroxysmal, right     Patient Active Problem List   Diagnosis Date Noted   History of ductal carcinoma in situ (DCIS) of breast 11/20/2022   Hyperlipidemia 11/20/2022   Overweight (BMI 25.0-29.9) 11/20/2022   Prolapsed internal hemorrhoids, grade 3 02/06/2022   Hypothyroidism 09/24/2015    Past Surgical History:  Procedure Laterality Date   AUGMENTATION MAMMAPLASTY     BREAST LUMPECTOMY  10/02/2011   Procedure: LUMPECTOMY;  Surgeon: Maisie Fus A. Cornett, MD;  Location: [facility];  Service: General;  Laterality: Right;  right breast lumpectomy   BREAST SURGERY  2003    breast lift   COLONOSCOPY N/A 10/14/2016   redundant colon, nonthrombosed external hemorrhoids, large internal hemorrhoids s/p banding x3.    HEMORRHOID BANDING N/A 10/14/2016   Procedure: HEMORRHOID BANDING;  Surgeon: West Bali, MD;  Location: [facility];  Service: Endoscopy;  Laterality: N/A;   LUMBAR DISC SURGERY  08/2012   L4-5 microdiscectomy    TUBAL LIGATION      OB History     Gravida  2   Para  2   Term      Preterm      AB      Living         SAB      IAB      Ectopic      Multiple      Live Births           Obstetric Comments  Menarche age 61, ist pregnancy age 9 . 2 vah=ginal births, 2 daughters          Home Medications    Prior to Admission medications   Medication Sig Start Date End Date Taking? Authorizing Provider  ondansetron (ZOFRAN) 4 MG tablet Take 1 tablet (4 mg total) by mouth every 8 (eight) hours as needed for nausea or vomiting. 06/11/23  Yes Thara Searing-Warren, Sadie Haber, NP  citalopram (CELEXA) 20 MG tablet Take 1 tablet (20 mg total) by mouth daily. 11/20/22   Everlene Other  G, DO  hydrocortisone 2.5 % cream Apply topically 2 (two) times daily. 04/17/23   Waldon Merl, PA-C  ibuprofen (ADVIL,MOTRIN) 200 MG tablet Take 400 mg by mouth every 4 (four) hours as needed for moderate pain.    [provider]  KRILL OIL PO Take by mouth.    [provider]  levothyroxine (SYNTHROID) 125 MCG tablet Take 1 tablet (125 mcg total) by mouth daily. 02/26/23   Tommie Sams, DO  Multiple Vitamins-Minerals (MULTIVITAMIN ADULTS PO) Take by mouth.    [provider]  naproxen (NAPROSYN) 500 MG tablet Take 1 tablet (500 mg total) by mouth 2 (two) times daily with a meal. 05/01/23   Waldon Merl, PA-C  NON FORMULARY B12 , vitamin c , elderberry    [provider]  valACYclovir (VALTREX) 1000 MG tablet Take 2 tablets (2,000 mg total) by mouth every 12 (twelve) hours. Patient not taking: Reported on 11/20/2022  01/29/22     VITAMIN D PO Take by mouth.    [provider]  Zinc 50 MG TABS Take by mouth.    [provider]    Family History Family History  Problem Relation Age of Onset   COPD Mother    Atrial fibrillation Mother    Cancer Father        NSCLC   Sleep apnea Brother    Colon cancer Neg Hx    Celiac disease Neg Hx    Inflammatory bowel disease Neg Hx    Thyroid disease Neg Hx    Allergic rhinitis Neg Hx    Asthma Neg Hx    Eczema Neg Hx    Urticaria Neg Hx     Social History Social History   Tobacco Use   Smoking status: Never   Smokeless tobacco: Never  Vaping Use   Vaping status: Never Used  Substance Use Topics   Alcohol use: Yes    Comment: social drinker   Drug use: No     Allergies   Patient has no known allergies.   Review of Systems Review of Systems Per HPI  Physical Exam Triage Vital Signs ED Triage Vitals  Encounter Vitals Group     BP 06/11/23 1721 120/65     Systolic BP Percentile --      Diastolic BP Percentile --      Pulse Rate 06/11/23 1721 63     Resp 06/11/23 1721 18     Temp 06/11/23 1721 98.8 F (37.1 C)     Temp Source 06/11/23 1721 Oral     SpO2 06/11/23 1721 95 %     Weight --      Height --      Head Circumference --      Peak Flow --      Pain Score 06/11/23 1722 0     Pain Loc --      Pain Education --      Exclude from Growth Chart --    No data found.  Updated Vital Signs BP 120/65 (BP Location: Right Arm)   Pulse 63   Temp 98.8 F (37.1 C) (Oral)   Resp 18   SpO2 95%   Visual Acuity Right Eye Distance:   Left Eye Distance:   Bilateral Distance:    Right Eye Near:   Left Eye Near:    Bilateral Near:     Physical Exam Vitals and nursing note reviewed.  Constitutional:      General: She  is not in acute distress.    Appearance: Normal appearance.  HENT:     Head: Normocephalic.     Right Ear: Tympanic membrane, ear canal and external ear normal.     Left Ear: Tympanic membrane,  ear canal and external ear normal.     Nose: Nose normal.     Mouth/Throat:     Lips: Pink.     Mouth: Mucous membranes are moist.     Pharynx: Oropharynx is clear. Uvula midline. No posterior oropharyngeal erythema.  Eyes:     Extraocular Movements: Extraocular movements intact.     Pupils: Pupils are equal, round, and reactive to light.  Cardiovascular:     Rate and Rhythm: Normal rate and regular rhythm.     Pulses: Normal pulses.     Heart sounds: Normal heart sounds.  Pulmonary:     Effort: Pulmonary effort is normal. No respiratory distress.     Breath sounds: Normal breath sounds. No stridor. No wheezing, rhonchi or rales.  Abdominal:     General: Bowel sounds are normal. There is no distension.     Palpations: Abdomen is soft.     Tenderness: There is no abdominal tenderness. There is no right CVA tenderness, left CVA tenderness, guarding or rebound.     Comments: P.o. challenge performed after administration of Zofran.  Patient is drinking water, she is tolerating well.  Denies nausea.  Musculoskeletal:     Cervical back: Normal range of motion.  Skin:    General: Skin is warm and dry.  Neurological:     General: No focal deficit present.     Mental Status: She is alert and oriented to person, place, and time.  Psychiatric:        Mood and Affect: Mood normal.      UC Treatments / Results  Labs (all labs ordered are listed, but only abnormal results are displayed) Labs Reviewed  POCT URINALYSIS DIP (MANUAL ENTRY) - Abnormal; Notable for the following components:      Result Value   Ketones, POC UA trace (5) (*)    Blood, UA small (*)    Leukocytes, UA Small (1+) (*)    All other components within normal limits  URINE CULTURE    EKG   Radiology No results found.  Procedures Procedures (including critical care time)  Medications Ordered in UC Medications  ondansetron (ZOFRAN-ODT) disintegrating tablet 4 mg (4 mg Oral Given 06/11/23 1737)    Initial  Impression / Assessment and Plan / UC Course  I have reviewed the triage vital signs and the nursing notes.  Pertinent labs & imaging results that were available during my care of the patient were reviewed by me and considered in my medical decision making (see chart for details).  The patient is well-appearing, she is in no acute distress, vital signs are stable.  Urinalysis is positive for small leukocytes and blood.  Patient has not complained of urinary symptoms, urine culture ordered.  On exam, patient is well-appearing, she is afebrile, and has no abdominal tenderness.  Do not suspect acute abdomen at this time.  Will treat patient for nausea and vomiting.  Zofran 4 mg ODT given will p.o. trial performed, patient was able to tolerate liquids without difficulty.  Zofran 4 mg ODT prescribed for continued nausea and vomiting.  Supportive care recommendations were provided and discussed with the patient to include over-the-counter analgesics, recommending a step up diet, and increasing fluids.  Patient was given strict ER  follow-up precautions.  Patient is in agreement with this plan of care and verbalizes understanding.  All questions were answered.  Patient stable for discharge.  Final Clinical Impressions(s) / UC Diagnoses   Final diagnoses:  Nausea and vomiting, unspecified vomiting type     Discharge Instructions      The urinalysis did show small leukocytes and blood, urine culture is pending.  You will be contacted if the pending test result is abnormal. Take medication as prescribed. May take over-the-counter Tylenol or ibuprofen as needed for pain, fever, or general discomfort. Recommend starting with clear liquids such as chicken broth, Jell-O, Sprite, ginger ale, or popsicles.  If you are able to tolerate clear liquids, recommend a soft diet to include soft fruits and vegetables, if you are able to tolerate a soft diet, you can advance to a regular diet. Continue to drink plenty  of fluids.  Try to drink at least 8-10 8 ounce glasses of water while symptoms persist. If you begin to experience worsening symptoms such as abdominal pain, fever, nausea or vomiting that will not stop, please go to the emergency department immediately for further evaluation. Follow-up as needed.     ED Prescriptions     Medication Sig Dispense Auth. Provider   ondansetron (ZOFRAN) 4 MG tablet Take 1 tablet (4 mg total) by mouth every 8 (eight) hours as needed for nausea or vomiting. 20 tablet Alaycia Eardley-Warren, Sadie Haber, NP      PDMP not reviewed this encounter.   Abran Cantor, NP 06/11/23 1829

## 2023-06-11 NOTE — Discharge Instructions (Addendum)
The urinalysis did show small leukocytes and blood, urine culture is pending.  You will be contacted if the pending test result is abnormal. Take medication as prescribed. May take over-the-counter Tylenol or ibuprofen as needed for pain, fever, or general discomfort. Recommend starting with clear liquids such as chicken broth, Jell-O, Sprite, ginger ale, or popsicles.  If you are able to tolerate clear liquids, recommend a soft diet to include soft fruits and vegetables, if you are able to tolerate a soft diet, you can advance to a regular diet. Continue to drink plenty of fluids.  Try to drink at least 8-10 8 ounce glasses of water while symptoms persist. If you begin to experience worsening symptoms such as abdominal pain, fever, nausea or vomiting that will not stop, please go to the emergency department immediately for further evaluation. Follow-up as needed.

## 2023-06-11 NOTE — ED Triage Notes (Signed)
Vomiting since Tuesday.  Home covid test today was negative.

## 2023-06-18 ENCOUNTER — Ambulatory Visit (HOSPITAL_COMMUNITY): Payer: 59

## 2023-06-18 ENCOUNTER — Ambulatory Visit (HOSPITAL_COMMUNITY)
Admission: RE | Admit: 2023-06-18 | Discharge: 2023-06-18 | Disposition: A | Discharge status: Home / Self Care | Payer: 59 | Source: Ambulatory Visit | Attending: Obstetrics and Gynecology | Admitting: Obstetrics and Gynecology

## 2023-06-18 DIAGNOSIS — Z1231 Encounter for screening mammogram for malignant neoplasm of breast: Secondary | ICD-10-CM | POA: Insufficient documentation

## 2023-06-18 DIAGNOSIS — Z780 Asymptomatic menopausal state: Secondary | ICD-10-CM | POA: Insufficient documentation

## 2023-07-16 ENCOUNTER — Encounter: Payer: Self-pay | Admitting: Nurse Practitioner

## 2023-07-16 ENCOUNTER — Ambulatory Visit (INDEPENDENT_AMBULATORY_CARE_PROVIDER_SITE_OTHER): Payer: 59 | Admitting: Nurse Practitioner

## 2023-07-16 VITALS — BP 130/68 | HR 63 | Temp 98.6°F | Ht 64.5 in | Wt 167.4 lb

## 2023-07-16 DIAGNOSIS — Z0000 Encounter for general adult medical examination without abnormal findings: Secondary | ICD-10-CM | POA: Diagnosis not present

## 2023-07-16 DIAGNOSIS — M8580 Other specified disorders of bone density and structure, unspecified site: Secondary | ICD-10-CM | POA: Insufficient documentation

## 2023-07-16 NOTE — Progress Notes (Signed)
Subjective:    Patient ID: Dawn Barton, female    DOB: 10/20/1966, 57 y.o.   MRN: [id number]  HPI The patient comes in today for a wellness visit.    A review of their health history was completed.  A review of medications was also completed.  Any needed refills; No  Eating habits: Good; overall healthy  Falls/  MVA accidents in past few months: No  Regular exercise: Yes; runs on a regular basis; does strength training   Specialist pt sees on regular basis: No  Preventative health issues were discussed.   Additional concerns: None at this time Regular vision and dental exams. Has had mammogram and bone density through her GYN.  Review of Systems  Constitutional:  Negative for activity change, appetite change and fatigue.  HENT:  Negative for sore throat and trouble swallowing.   Respiratory:  Negative for cough, chest tightness, shortness of breath and wheezing.   Cardiovascular:  Negative for chest pain.  Gastrointestinal:  Negative for abdominal distention, abdominal pain, constipation, diarrhea, nausea and vomiting.      07/16/2023    1:25 PM  Depression screen PHQ 2/9  Decreased Interest 0  Down, Depressed, Hopeless 0  PHQ - 2 Score 0  Altered sleeping 0  Tired, decreased energy 0  Change in appetite 0  Feeling bad or failure about yourself  0  Trouble concentrating 0  Moving slowly or fidgety/restless 0  Suicidal thoughts 0  PHQ-9 Score 0  Difficult doing work/chores Not difficult at all      07/16/2023    1:25 PM 11/20/2022   10:41 AM  GAD 7 : Generalized Anxiety Score  Nervous, Anxious, on Edge 0 0  Control/stop worrying 0 0  Worry too much - different things 0 0  Trouble relaxing 0 0  Restless 0 0  Easily annoyed or irritable 0 0  Afraid - awful might happen 0 0  Total GAD 7 Score 0 0  Anxiety Difficulty  Not difficult at all         Objective:   Physical Exam Constitutional:      General: She is not in acute distress.     Appearance: She is well-developed.  Neck:     Thyroid: No thyromegaly.     Trachea: No tracheal deviation.     Comments: Thyroid non tender to palpation. No mass or goiter noted.  Cardiovascular:     Rate and Rhythm: Normal rate and regular rhythm.     Heart sounds: Normal heart sounds. No murmur heard. Pulmonary:     Effort: Pulmonary effort is normal.     Breath sounds: Normal breath sounds.  Abdominal:     General: There is no distension.     Palpations: Abdomen is soft.     Tenderness: There is no abdominal tenderness.  Musculoskeletal:     Cervical back: Normal range of motion and neck supple.  Lymphadenopathy:     Cervical: No cervical adenopathy.     Upper Body:     Right upper body: No supraclavicular adenopathy.     Left upper body: No supraclavicular adenopathy.  Skin:    General: Skin is warm and dry.     Findings: No rash.  Neurological:     Mental Status: She is alert and oriented to person, place, and time.  Psychiatric:        Mood and Affect: Mood normal.        Behavior: Behavior normal.  Thought Content: Thought content normal.        Judgment: Judgment normal.     The 10-year ASCVD risk score (Arnett DK, et al., 2019) is: 2.5%   Values used to calculate the score:     Age: 70 years     Sex: Female     Is Non-Hispanic African American: No     Diabetic: No     Tobacco smoker: No     Systolic Blood Pressure: 130 mmHg     Is BP treated: No     HDL Cholesterol: 75 mg/dL     Total Cholesterol: 238 mg/dL  Today's Vitals   21/30/86 1306  BP: 130/68  Pulse: 63  Temp: 98.6 F (37 C)  SpO2: 97%  Weight: 167 lb 6.4 oz (75.9 kg)  Height: 5' 4.5" (1.638 m)   Body mass index is 28.29 kg/m.  Labs 11/20/22.    Assessment & Plan:  Encounter for annual physical exam Encouraged continued healthy lifestyle habits.  Recommend yearly labs early 2025. Continue follow up for routine GYN care.  Return in about 1 year (around 07/15/2024) for  physical.

## 2023-08-24 ENCOUNTER — Other Ambulatory Visit (HOSPITAL_COMMUNITY): Payer: Self-pay

## 2023-08-24 MED ORDER — INFLUENZA VIRUS VACC SPLIT PF (FLUZONE) 0.5 ML IM SUSY
0.5000 mL | PREFILLED_SYRINGE | Freq: Once | INTRAMUSCULAR | 0 refills | Status: AC
  Filled 2023-08-24: qty 0.5, 1d supply, fill #0

## 2023-08-26 ENCOUNTER — Other Ambulatory Visit (HOSPITAL_COMMUNITY): Payer: Self-pay

## 2023-08-26 ENCOUNTER — Other Ambulatory Visit: Payer: Self-pay | Admitting: Family Medicine

## 2023-08-27 ENCOUNTER — Other Ambulatory Visit: Payer: Self-pay

## 2023-08-27 ENCOUNTER — Other Ambulatory Visit (HOSPITAL_COMMUNITY): Payer: Self-pay

## 2023-08-27 MED ORDER — LEVOTHYROXINE SODIUM 125 MCG PO TABS
125.0000 ug | ORAL_TABLET | Freq: Every day | ORAL | 1 refills | Status: DC
  Filled 2023-08-27 – 2023-09-07 (×2): qty 90, 90d supply, fill #0
  Filled 2023-11-23: qty 90, 90d supply, fill #1

## 2023-08-28 ENCOUNTER — Other Ambulatory Visit: Payer: Self-pay

## 2023-08-28 ENCOUNTER — Encounter: Payer: Self-pay | Admitting: Pharmacist

## 2023-09-02 ENCOUNTER — Other Ambulatory Visit: Payer: Self-pay

## 2023-09-03 ENCOUNTER — Other Ambulatory Visit (HOSPITAL_COMMUNITY): Payer: Self-pay

## 2023-09-07 ENCOUNTER — Other Ambulatory Visit (HOSPITAL_COMMUNITY): Payer: Self-pay

## 2023-10-16 ENCOUNTER — Telehealth: Payer: 59 | Admitting: Family Medicine

## 2023-10-16 DIAGNOSIS — R3989 Other symptoms and signs involving the genitourinary system: Secondary | ICD-10-CM

## 2023-10-16 MED ORDER — CEPHALEXIN 500 MG PO CAPS: 500.0000 mg | ORAL_CAPSULE | Freq: Two times a day (BID) | ORAL | 0 refills | Status: AC

## 2023-10-16 NOTE — Progress Notes (Signed)

## 2023-10-20 ENCOUNTER — Other Ambulatory Visit: Payer: 59

## 2023-11-23 ENCOUNTER — Other Ambulatory Visit: Payer: Self-pay | Admitting: Family Medicine

## 2023-11-24 ENCOUNTER — Other Ambulatory Visit (HOSPITAL_COMMUNITY): Payer: Self-pay

## 2023-11-24 MED ORDER — CITALOPRAM HYDROBROMIDE 20 MG PO TABS
20.0000 mg | ORAL_TABLET | Freq: Every day | ORAL | 3 refills | Status: DC
  Filled 2023-11-24: qty 90, 90d supply, fill #0

## 2023-12-17 ENCOUNTER — Other Ambulatory Visit (HOSPITAL_COMMUNITY): Payer: Self-pay

## 2023-12-17 ENCOUNTER — Other Ambulatory Visit: Payer: Self-pay

## 2023-12-17 ENCOUNTER — Encounter: Payer: Self-pay | Admitting: Nurse Practitioner

## 2023-12-17 ENCOUNTER — Ambulatory Visit: Payer: Commercial Managed Care - PPO | Admitting: Nurse Practitioner

## 2023-12-17 VITALS — BP 110/66 | HR 77 | Temp 97.2°F | Ht 64.5 in | Wt 173.0 lb

## 2023-12-17 DIAGNOSIS — R5383 Other fatigue: Secondary | ICD-10-CM

## 2023-12-17 DIAGNOSIS — E038 Other specified hypothyroidism: Secondary | ICD-10-CM

## 2023-12-17 DIAGNOSIS — F909 Attention-deficit hyperactivity disorder, unspecified type: Secondary | ICD-10-CM | POA: Diagnosis not present

## 2023-12-17 DIAGNOSIS — R61 Generalized hyperhidrosis: Secondary | ICD-10-CM | POA: Insufficient documentation

## 2023-12-17 DIAGNOSIS — F330 Major depressive disorder, recurrent, mild: Secondary | ICD-10-CM

## 2023-12-17 DIAGNOSIS — E7800 Pure hypercholesterolemia, unspecified: Secondary | ICD-10-CM

## 2023-12-17 MED ORDER — HYDROCORTISONE 2.5 % EX CREA
TOPICAL_CREAM | Freq: Two times a day (BID) | CUTANEOUS | 0 refills | Status: DC
  Filled 2023-12-17: qty 30, 15d supply, fill #0

## 2023-12-17 NOTE — Progress Notes (Signed)
Subjective:    Patient ID: Dawn Barton, female    DOB: February 13, 1966, 58 y.o.   MRN: [id number]  HPI Presents requesting a recheck on her thyroid blood work.  Has noticed increased sweating at nighttime for the past couple of months.  Denies any changes in her medications or supplements.  Last time she had a problem was when she was undergoing treatment for breast cancer.  Gets regular preventive health physicals with gynecology.  Last mammogram 06/18/2023.  In addition patient has been having some slight sinus pressure which she thinks may be related to allergies. Has been having symptoms of what she describes as "brain fog".  Difficulty focusing and retaining information.  Has a strong family history of dementia.  Does not feel it is memory loss at this point.  Has had trouble over the years with focusing but seems to be worse lately.  States she is doing well on citalopram 20 mg daily. Very active lifestyle including regular running program.  States she has noticed more allergy symptoms when running through the woods.  Review of Systems  Constitutional:  Positive for fatigue. Negative for fever.  HENT:  Positive for congestion, postnasal drip and sinus pressure. Negative for ear pain, sinus pain, sore throat and trouble swallowing.   Respiratory:  Negative for choking, chest tightness, shortness of breath and wheezing.   Cardiovascular:  Negative for chest pain.      12/17/2023   11:35 AM  Depression screen PHQ 2/9  Decreased Interest 0  Down, Depressed, Hopeless 0  PHQ - 2 Score 0  Altered sleeping 0  Tired, decreased energy 1  Change in appetite 0  Feeling bad or failure about yourself  0  Trouble concentrating 0  Moving slowly or fidgety/restless 0  Suicidal thoughts 0  PHQ-9 Score 1  Difficult doing work/chores Not difficult at all      12/17/2023   11:35 AM 07/16/2023    1:25 PM 11/20/2022   10:41 AM  GAD 7 : Generalized Anxiety Score  Nervous, Anxious, on Edge 0 0 0   Control/stop worrying 0 0 0  Worry too much - different things 0 0 0  Trouble relaxing 0 0 0  Restless 1 0 0  Easily annoyed or irritable 0 0 0  Afraid - awful might happen 0 0 0  Total GAD 7 Score 1 0 0  Anxiety Difficulty Not difficult at all  Not difficult at all   Adult ADHD Self Report Scale (most recent)     Adult ADHD Self-Report Scale (ASRS-v1.1) Symptom Checklist - 12/17/23 1652       Part A   1. How often do you have trouble wrapping up the final details of a project, once the challenging parts have been done? Often  2. How often do you have difficulty getting things done in order when you have to do a task that requires organization? Very Often    3. How often do you have problems remembering appointments or obligations? Often  4. When you have a task that requires a lot of thought, how often do you avoid or delay getting started? Very Often    5. How often do you fidget or squirm with your hands or feet when you have to sit down for a long time? Often  6. How often do you feel overly active and compelled to do things, like you were driven by a motor? Often      Part B   7.  How often do you make careless mistakes when you have to work on a boring or difficult project? Often  8. How often do you have difficulty keeping your attention when you are doing boring or repetitive work? Very Often    9. How often do you have difficulty concentrating on what people say to you, even when they are speaking to you directly? Very Often  10. How often do you misplace or have difficulty finding things at home or at work? Very Often    11. How often are you distracted by activity or noise around you? Very Often  12. How often do you leave your seat in meetings or other situations in which you are expected to remain seated? Sometimes    13. How often do you feel restless or fidgety? Very Often  14. How often do you have difficulty unwinding and relaxing when you have time to yourself? Very Often     15. How often do you find yourself talking too much when you are in social situations? Very Often  16. When you are in a conversation, how often do you find yourself finishing the sentences of the people you are talking to, before they can finish them themselves? Very Often    17. How often do you have difficulty waiting your turn in situations when turn taking is required? Often  18. How often do you interrupt others when they are busy? Sometimes      Comment   How old were you when these problems first began to occur? --   Has had problems over her lifetime; worse recently                  Objective:   Physical Exam NAD.  Alert, oriented.  Calm cheerful affect.  Speech clear.  Thoughts logical coherent and relevant.  No obvious memory loss or impaired judgment.  TMs minimal clear effusion, no erythema.  Thyroid nontender to palpation, no mass or goiter noted.  Pharynx clear and moist.  Neck supple with mild soft anterior cervical adenopathy.  Lungs clear.  Heart regular rate rhythm.  The 10-year ASCVD risk score (Arnett DK, et al., 2019) is: 1.9%   Values used to calculate the score:     Age: 7 years     Sex: Female     Is Non-Hispanic African American: No     Diabetic: No     Tobacco smoker: No     Systolic Blood Pressure: 110 mmHg     Is BP treated: No     HDL Cholesterol: 72 mg/dL     Total Cholesterol: 235 mg/dL      Assessment & Plan:   Problem List Items Addressed This Visit       Endocrine   Hypothyroidism - Primary   Relevant Orders   TSH (Completed)     Other   Adult ADHD   Hyperlipidemia   Relevant Orders   Lipid panel (Completed)   Mild episode of recurrent major depressive disorder (HCC)   Night sweats   Other Visit Diagnoses       Fatigue, unspecified type       Relevant Orders   CBC with Differential/Platelet (Completed)   Comprehensive metabolic panel (Completed)      Labs pending. Continue current medications as directed. A review of  the ASRS after the visit indicates the patient test positive for adult ADHD.  Will contact her to schedule an appointment to discuss treatment options. Encourage continued  healthy lifestyle habits. Follow-up with gynecology for GYN exam and mammogram. Meds ordered this encounter  Medications   hydrocortisone 2.5 % cream    Sig: Apply topically 2 (two) times daily.    Dispense:  30 g    Refill:  0   Return in about 6 months (around 06/15/2024).

## 2023-12-18 ENCOUNTER — Encounter: Payer: Self-pay | Admitting: Nurse Practitioner

## 2023-12-18 DIAGNOSIS — F909 Attention-deficit hyperactivity disorder, unspecified type: Secondary | ICD-10-CM | POA: Insufficient documentation

## 2023-12-18 LAB — CBC WITH DIFFERENTIAL/PLATELET
Basophils Absolute: 0 10*3/uL (ref 0.0–0.2)
Basos: 1 %
EOS (ABSOLUTE): 0.1 10*3/uL (ref 0.0–0.4)
Eos: 2 %
Hematocrit: 42.6 % (ref 34.0–46.6)
Hemoglobin: 14 g/dL (ref 11.1–15.9)
Immature Grans (Abs): 0 10*3/uL (ref 0.0–0.1)
Immature Granulocytes: 0 %
Lymphocytes Absolute: 1.8 10*3/uL (ref 0.7–3.1)
Lymphs: 34 %
MCH: 28.8 pg (ref 26.6–33.0)
MCHC: 32.9 g/dL (ref 31.5–35.7)
MCV: 88 fL (ref 79–97)
Monocytes Absolute: 0.6 10*3/uL (ref 0.1–0.9)
Monocytes: 11 %
Neutrophils Absolute: 2.8 10*3/uL (ref 1.4–7.0)
Neutrophils: 52 %
Platelets: 278 10*3/uL (ref 150–450)
RBC: 4.86 x10E6/uL (ref 3.77–5.28)
RDW: 13.8 % (ref 11.7–15.4)
WBC: 5.3 10*3/uL (ref 3.4–10.8)

## 2023-12-18 LAB — COMPREHENSIVE METABOLIC PANEL
ALT: 17 [IU]/L (ref 0–32)
AST: 16 [IU]/L (ref 0–40)
Albumin: 4.7 g/dL (ref 3.8–4.9)
Alkaline Phosphatase: 75 [IU]/L (ref 44–121)
BUN/Creatinine Ratio: 16 (ref 9–23)
BUN: 12 mg/dL (ref 6–24)
Bilirubin Total: 0.4 mg/dL (ref 0.0–1.2)
CO2: 26 mmol/L (ref 20–29)
Calcium: 9.8 mg/dL (ref 8.7–10.2)
Chloride: 102 mmol/L (ref 96–106)
Creatinine, Ser: 0.77 mg/dL (ref 0.57–1.00)
Globulin, Total: 2.3 g/dL (ref 1.5–4.5)
Glucose: 89 mg/dL (ref 70–99)
Potassium: 4.3 mmol/L (ref 3.5–5.2)
Sodium: 142 mmol/L (ref 134–144)
Total Protein: 7 g/dL (ref 6.0–8.5)
eGFR: 89 mL/min/{1.73_m2} (ref >59)

## 2023-12-18 LAB — TSH: TSH: 1.15 u[IU]/mL (ref 0.450–4.500)

## 2023-12-18 LAB — LIPID PANEL
Chol/HDL Ratio: 3.3 {ratio} (ref 0.0–4.4)
Cholesterol, Total: 235 mg/dL — ABNORMAL HIGH (ref 100–199)
HDL: 72 mg/dL (ref >39)
LDL Chol Calc (NIH): 149 mg/dL — ABNORMAL HIGH (ref 0–99)
Triglycerides: 82 mg/dL (ref 0–149)
VLDL Cholesterol Cal: 14 mg/dL (ref 5–40)

## 2023-12-19 ENCOUNTER — Encounter: Payer: Self-pay | Admitting: Nurse Practitioner

## 2023-12-19 DIAGNOSIS — F330 Major depressive disorder, recurrent, mild: Secondary | ICD-10-CM | POA: Insufficient documentation

## 2023-12-22 ENCOUNTER — Ambulatory Visit: Payer: Commercial Managed Care - PPO | Admitting: Nurse Practitioner

## 2023-12-22 VITALS — BP 105/68 | HR 63 | Temp 97.1°F | Ht 64.5 in | Wt 175.0 lb

## 2023-12-22 DIAGNOSIS — F909 Attention-deficit hyperactivity disorder, unspecified type: Secondary | ICD-10-CM | POA: Diagnosis not present

## 2023-12-22 DIAGNOSIS — E7800 Pure hypercholesterolemia, unspecified: Secondary | ICD-10-CM

## 2023-12-22 MED ORDER — AMPHETAMINE-DEXTROAMPHET ER 10 MG PO CP24: 10.0000 mg | ORAL_CAPSULE | Freq: Every day | ORAL | 0 refills | Status: DC

## 2023-12-22 NOTE — Progress Notes (Unsigned)
 Marland Kitchen

## 2023-12-23 ENCOUNTER — Encounter: Payer: Self-pay | Admitting: Nurse Practitioner

## 2023-12-23 NOTE — Progress Notes (Signed)
Subjective:    Patient ID: Dawn Barton, female    DOB: [birth year], 58 y.o.   MRN: [id number]  HPI Presents to discuss recent labs, particularly her lipid profile.  Also wants to discuss results of her ASRS for adult ADD. States her Citalopram is working well. Has always had some issues with focus and staying on task, but worse lately. No memory loss. Mainly difficulty multitasking which is required at times for her job. Major changes with her job which has added to her issues.  No history of cardiac problems.  See previous note.    Review of Systems  Respiratory:  Negative for cough, chest tightness, shortness of breath and wheezing.   Cardiovascular:  Negative for chest pain.  Psychiatric/Behavioral:  Positive for decreased concentration. Negative for self-injury, sleep disturbance and suicidal ideas. The patient is not nervous/anxious.       12/22/2023   11:24 AM  Depression screen PHQ 2/9  Decreased Interest 0  Down, Depressed, Hopeless 0  PHQ - 2 Score 0  Altered sleeping 0  Tired, decreased energy 0  Change in appetite 0  Feeling bad or failure about yourself  0  Trouble concentrating 1  Moving slowly or fidgety/restless 0  Suicidal thoughts 0  PHQ-9 Score 1  Difficult doing work/chores Not difficult at all      12/22/2023   11:25 AM 12/17/2023   11:35 AM 07/16/2023    1:25 PM 11/20/2022   10:41 AM  GAD 7 : Generalized Anxiety Score  Nervous, Anxious, on Edge 0 0 0 0  Control/stop worrying 0 0 0 0  Worry too much - different things 0 0 0 0  Trouble relaxing 0 0 0 0  Restless 0 1 0 0  Easily annoyed or irritable 0 0 0 0  Afraid - awful might happen 0 0 0 0  Total GAD 7 Score 0 1 0 0  Anxiety Difficulty Not difficult at all Not difficult at all  Not difficult at all         Objective:   Physical Exam NAD. Alert, oriented.  Results for orders placed or performed in visit on 12/17/23  CBC with Differential/Platelet   Collection Time: 12/17/23 12:21 PM   Result Value Ref Range   WBC 5.3 3.4 - 10.8 x10E3/uL   RBC 4.86 3.77 - 5.28 x10E6/uL   Hemoglobin 14.0 11.1 - 15.9 g/dL   Hematocrit 27.0 62.3 - 46.6 %   MCV 88 79 - 97 fL   MCH 28.8 26.6 - 33.0 pg   MCHC 32.9 31.5 - 35.7 g/dL   RDW 76.2 83.1 - 51.7 %   Platelets 278 150 - 450 x10E3/uL   Neutrophils 52 Not Estab. %   Lymphs 34 Not Estab. %   Monocytes 11 Not Estab. %   Eos 2 Not Estab. %   Basos 1 Not Estab. %   Neutrophils Absolute 2.8 1.4 - 7.0 x10E3/uL   Lymphocytes Absolute 1.8 0.7 - 3.1 x10E3/uL   Monocytes Absolute 0.6 0.1 - 0.9 x10E3/uL   EOS (ABSOLUTE) 0.1 0.0 - 0.4 x10E3/uL   Basophils Absolute 0.0 0.0 - 0.2 x10E3/uL   Immature Granulocytes 0 Not Estab. %   Immature Grans (Abs) 0.0 0.0 - 0.1 x10E3/uL  Comprehensive metabolic panel   Collection Time: 12/17/23 12:21 PM  Result Value Ref Range   Glucose 89 70 - 99 mg/dL   BUN 12 6 - 24 mg/dL   Creatinine, Ser 6.16 0.57 - 1.00 mg/dL  eGFR 89 >59 mL/min/1.73   BUN/Creatinine Ratio 16 9 - 23   Sodium 142 134 - 144 mmol/L   Potassium 4.3 3.5 - 5.2 mmol/L   Chloride 102 96 - 106 mmol/L   CO2 26 20 - 29 mmol/L   Calcium 9.8 8.7 - 10.2 mg/dL   Total Protein 7.0 6.0 - 8.5 g/dL   Albumin 4.7 3.8 - 4.9 g/dL   Globulin, Total 2.3 1.5 - 4.5 g/dL   Bilirubin Total 0.4 0.0 - 1.2 mg/dL   Alkaline Phosphatase 75 44 - 121 IU/L   AST 16 0 - 40 IU/L   ALT 17 0 - 32 IU/L  Lipid panel   Collection Time: 12/17/23 12:21 PM  Result Value Ref Range   Cholesterol, Total 235 (H) 100 - 199 mg/dL   Triglycerides 82 0 - 149 mg/dL   HDL 72 >60 mg/dL   VLDL Cholesterol Cal 14 5 - 40 mg/dL   LDL Chol Calc (NIH) 454 (H) 0 - 99 mg/dL   Chol/HDL Ratio 3.3 0.0 - 4.4 ratio  TSH   Collection Time: 12/17/23 12:21 PM  Result Value Ref Range   TSH 1.150 0.450 - 4.500 uIU/mL   The 10-year ASCVD risk score (Arnett DK, et al., 2019) is: 1.7%   Values used to calculate the score:     Age: 1 years     Sex: Female     Is Non-Hispanic African  American: No     Diabetic: No     Tobacco smoker: No     Systolic Blood Pressure: 105 mmHg     Is BP treated: No     HDL Cholesterol: 72 mg/dL     Total Cholesterol: 235 mg/dL  0/98/1191: CT cardiac scoring 2.4       Assessment & Plan:   Problem List Items Addressed This Visit       Other   Adult ADHD - Primary   Hyperlipidemia   Meds ordered this encounter  Medications   amphetamine-dextroamphetamine (ADDERALL XR) 10 MG 24 hr capsule    Sig: Take 1 capsule (10 mg total) by mouth daily.    Dispense:  30 capsule    Refill:  0    Supervising Provider:   Babs Sciara [phone]   Reviewed labs with patient.  Discussed options with patient based on current algorithm per [facility] cardiology. She has spoken with a colleague in cardiology who advised her since her score is less than 10 she can hold on statin. Defers for now. Agrees to yearly lipid profile and CT cardiac score every 3 years.  Will start low dose Adderall XR. Reviewed potential adverse effects. DC and contact office if any problems. Patient mainly plans to use this on days she works. Send a note about effectiveness at this dose before more refills. Monitor BP and pulse.  Return in about 3 months (around 03/20/2024).

## 2024-01-25 DIAGNOSIS — M84362A Stress fracture, left tibia, initial encounter for fracture: Secondary | ICD-10-CM | POA: Diagnosis not present

## 2024-02-22 ENCOUNTER — Other Ambulatory Visit: Payer: Self-pay

## 2024-02-22 ENCOUNTER — Other Ambulatory Visit: Payer: Self-pay | Admitting: Family Medicine

## 2024-02-22 MED ORDER — LEVOTHYROXINE SODIUM 125 MCG PO TABS
125.0000 ug | ORAL_TABLET | Freq: Every day | ORAL | 1 refills | Status: DC
  Filled 2024-02-22: qty 90, 90d supply, fill #0
  Filled 2024-06-05 – 2024-06-10 (×2): qty 90, 90d supply, fill #1

## 2024-03-24 ENCOUNTER — Ambulatory Visit: Payer: Commercial Managed Care - PPO | Admitting: Nurse Practitioner

## 2024-03-29 ENCOUNTER — Encounter: Payer: Self-pay | Admitting: Gastroenterology

## 2024-03-29 ENCOUNTER — Ambulatory Visit (INDEPENDENT_AMBULATORY_CARE_PROVIDER_SITE_OTHER): Admitting: Gastroenterology

## 2024-03-29 VITALS — BP 119/80 | HR 79 | Temp 97.1°F | Ht 65.0 in | Wt 168.1 lb

## 2024-03-29 DIAGNOSIS — K642 Third degree hemorrhoids: Secondary | ICD-10-CM

## 2024-03-29 DIAGNOSIS — E039 Hypothyroidism, unspecified: Secondary | ICD-10-CM | POA: Diagnosis not present

## 2024-03-29 DIAGNOSIS — Z01419 Encounter for gynecological examination (general) (routine) without abnormal findings: Secondary | ICD-10-CM | POA: Diagnosis not present

## 2024-03-29 DIAGNOSIS — Z853 Personal history of malignant neoplasm of breast: Secondary | ICD-10-CM | POA: Diagnosis not present

## 2024-03-29 DIAGNOSIS — N952 Postmenopausal atrophic vaginitis: Secondary | ICD-10-CM | POA: Diagnosis not present

## 2024-03-29 NOTE — Patient Instructions (Signed)
 We will set you up for a colonoscopy with Dr. Mordechai April in the near future!  We will continue to try and get some results with banding; however, we may ultimately need to refer you to a surgeon.  I will see you back in about 2 weeks to do further banding!  I enjoyed seeing you again today! I value our relationship and want to provide genuine, compassionate, and quality care. You may receive a survey regarding your visit with me, and I welcome your feedback! Thanks so much for taking the time to complete this. I look forward to seeing you again.      Delman Ferns, PhD, ANP-BC [facility]

## 2024-03-29 NOTE — Progress Notes (Signed)
    [facility] BANDING PROCEDURE NOTE  Dawn Barton is a 59 y.o. female presenting today for consideration of hemorrhoid banding. Last colonoscopy in 2017 by Dr. Nolene Baumgarten with redundant colon, nonthrombosed external hemorrhoids, large internal hemorrhoids s/p banding x3 at time of colonoscopy. She has had outpatient banding last in April 2023. She had some relief for awhile from this, but now with persistent rectal bleeding, pressure, prolapsing, discomfort at times. She feels better if sitting at times. She would like to try banding again.   The patient presents with symptomatic grade 3 hemorrhoids, unresponsive to maximal medical therapy, requesting rubber band ligation of her hemorrhoidal disease. All risks, benefits, and alternative forms of therapy were described and informed consent was obtained.  In the left lateral decubitus position, anoscopic examination revealed grade 3 hemorrhoids in all positions. She has large external hemorrhoids as well.   The decision was made to band the left lateral internal hemorrhoid, and the [facility] O'Regan System was used to perform band ligation without complication. Digital anorectal examination was then performed to assure proper positioning of the band, and to adjust the banded tissue as required. The patient was discharged home without pain or other issues. Dietary and behavioral recommendations were given, along with follow-up instructions. The patient will return in several weeks for followup and possible additional banding as required.  We will try to achieve some relief with this round of banding, but it is likely she may need to see colorectal surgeon due to large hemorrhoids. We will also arrange a colonoscopy with Dr. Mordechai April in near future due to rectal bleeding, as last was in 2017.   No complications were encountered and the patient tolerated the procedure well.   Delman Ferns, PhD, ANP-BC [facility]

## 2024-04-06 ENCOUNTER — Encounter: Payer: Self-pay | Admitting: *Deleted

## 2024-04-06 ENCOUNTER — Other Ambulatory Visit: Payer: Self-pay | Admitting: *Deleted

## 2024-04-06 ENCOUNTER — Other Ambulatory Visit: Payer: Self-pay

## 2024-04-06 MED ORDER — PEG 3350-KCL-NA BICARB-NACL 420 G PO SOLR
4000.0000 mL | Freq: Once | ORAL | 0 refills | Status: AC
  Filled 2024-04-06: qty 4000, 1d supply, fill #0

## 2024-04-08 ENCOUNTER — Encounter: Payer: Self-pay | Admitting: Nurse Practitioner

## 2024-04-08 ENCOUNTER — Ambulatory Visit: Admitting: Nurse Practitioner

## 2024-04-08 VITALS — BP 108/69 | HR 63 | Temp 97.7°F | Ht 65.0 in | Wt 170.0 lb

## 2024-04-08 DIAGNOSIS — Z79899 Other long term (current) drug therapy: Secondary | ICD-10-CM

## 2024-04-08 DIAGNOSIS — E038 Other specified hypothyroidism: Secondary | ICD-10-CM | POA: Diagnosis not present

## 2024-04-08 DIAGNOSIS — R0602 Shortness of breath: Secondary | ICD-10-CM | POA: Diagnosis not present

## 2024-04-08 DIAGNOSIS — R002 Palpitations: Secondary | ICD-10-CM | POA: Diagnosis not present

## 2024-04-08 DIAGNOSIS — E7800 Pure hypercholesterolemia, unspecified: Secondary | ICD-10-CM

## 2024-04-08 MED ORDER — CITALOPRAM HYDROBROMIDE 20 MG PO TABS
30.0000 mg | ORAL_TABLET | Freq: Every day | ORAL | 0 refills | Status: DC
  Filled 2024-06-08: qty 45, 30d supply, fill #0

## 2024-04-08 MED ORDER — ROSUVASTATIN CALCIUM 5 MG PO TABS
5.0000 mg | ORAL_TABLET | Freq: Every day | ORAL | 0 refills | Status: AC
  Filled 2024-08-01: qty 60, 60d supply, fill #0

## 2024-04-08 NOTE — Progress Notes (Signed)
   Subjective:    Patient ID: Dawn Barton, female    DOB: Jun 16, 1966, 58 y.o.   MRN: [id number]  HPI 3 month follow up no concerns voiced at this time     Review of Systems     Objective:   Physical Exam        Assessment & Plan:

## 2024-04-08 NOTE — Progress Notes (Signed)
 Subjective:    Patient ID: Dawn Barton, female    DOB: August 30, 1966, 58 y.o.   MRN: [id number]  HPI Presents for routine follow-up.  Has been off her Adderall for a while.  Back in March after a long hike due the end, patient became very tired and short of breath.  Finally had to stop completely.  She has been under extreme stress due to some family issues that morning had drank 2 energy drinks which she thinks may have been a factor.  Has not had any further episodes but has had some brief episodes of palpitations.  No syncope.  No chest pain or tightness.  States her mother had aortic valve dysfunction, had an echocardiogram about 4 years ago and wondering if she needs to repeat this.  Has her colonoscopy coming up soon. 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  HENT:  Negative for sore throat and trouble swallowing.   Respiratory:  Positive for shortness of breath. Negative for cough, chest tightness and wheezing.        During palpitation sometimes feels like it is hard to get her breath.  Cardiovascular:  Positive for palpitations. Negative for chest pain.      04/08/2024    9:57 AM 12/22/2023   11:24 AM 12/17/2023   11:35 AM 07/16/2023    1:25 PM 11/20/2022   10:41 AM  Depression screen PHQ 2/9  Decreased Interest 1 0 0 0 0  Down,  Depressed, Hopeless 0 0 0 0 0  PHQ - 2 Score 1 0 0 0 0  Altered sleeping 0 0 0 0 0  Tired, decreased energy 1 0 1 0 0  Change in appetite 1 0 0 0 0  Feeling bad or failure about yourself  0 0 0 0 0  Trouble concentrating 1 1 0 0 0  Moving slowly or fidgety/restless 0 0 0 0 0  Suicidal thoughts 0 0 0 0 0  PHQ-9 Score 4 1 1  0 0  Difficult doing work/chores Not difficult at all Not difficult at all Not difficult at all Not difficult at all Not difficult at all      04/08/2024    9:58 AM 12/22/2023   11:25 AM 12/17/2023   11:35 AM 07/16/2023    1:25 PM  GAD 7 : Generalized Anxiety Score  Nervous, Anxious, on Edge 1 0 0 0  Control/stop worrying 0 0 0 0  Worry too much - different things 0 0 0 0  Trouble relaxing 0 0 0 0  Restless 0 0 1 0  Easily annoyed or irritable 0 0 0 0  Afraid - awful might happen 0 0 0 0  Total GAD 7  Score 1 0 1 0  Anxiety Difficulty Not difficult at all Not difficult at all Not difficult at all         Objective:   Physical Exam Constitutional:      General: She is not in acute distress. Neck:     Comments: Thyroid  nontender to palpation, no mass or goiter noted. Cardiovascular:     Rate and Rhythm: Normal rate and regular rhythm.     Heart sounds: Normal heart sounds. No murmur heard.    No gallop.  Pulmonary:     Effort: Pulmonary effort is normal.     Breath sounds: Normal breath sounds.  Neurological:     Mental Status: She is alert.  Psychiatric:        Mood and Affect: Mood normal.        Behavior: Behavior normal.        Thought Content: Thought content normal.        Judgment: Judgment normal.   Calm cheerful affect.  Making good eye contact.  Thoughts logical coherent and relevant.  Speech clear.  Normal judgment and behavior.  Last lipid panel dated 12/17/2023: Total cholesterol 235, triglycerides 82, HDL 72, LDL 149. The 10-year ASCVD risk score (Arnett DK, et al., 2019) is: 2%   Values used to calculate the score:     Age: 70 years      Sex: Female     Is Non-Hispanic African American: No     Diabetic: No     Tobacco smoker: No     Systolic Blood Pressure: 108 mmHg     Is BP treated: No     HDL Cholesterol: 72 mg/dL     Total Cholesterol: 235 mg/dL Today's Vitals   16/10/96 0950  BP: 108/69  Pulse: 63  Temp: 97.7 F (36.5 C)  SpO2: 97%  Weight: 170 lb (77.1 kg)  Height: 5\' 5"  (1.651 m)   Body mass index is 28.29 kg/m.      Assessment & Plan:   Problem List Items Addressed This Visit       Endocrine   Hypothyroidism - Primary   Relevant Orders   TSH (Completed)   T4, free (Completed)     Other   Hyperlipidemia   Relevant Medications   rosuvastatin  (CRESTOR ) 5 MG tablet   Other Relevant Orders   Lipid panel   Other Visit Diagnoses       Palpitations       Relevant Orders   Ambulatory referral to Cardiology     SOBOE (shortness of breath on exertion)       Relevant Orders   Ambulatory referral to Cardiology     High risk medication use       Relevant Orders   Hepatic function panel      Meds ordered this encounter  Medications   citalopram  (CELEXA ) 20 MG tablet    Sig: Take 1 1/2 tabs (30 mg) once a day    Dispense:  135 tablet    Refill:  0    Supervising Provider:   Charlotta Cook A [[id number]]   rosuvastatin  (CRESTOR ) 5 MG tablet    Sig: Take 1 tablet (5 mg total) by mouth daily. For cholesterol    Dispense:  90 tablet    Refill:  0    Supervising Provider:   Charlotta Cook A [[id number]]   Discussed medication options to help her with increased stress, will increase citalopram  to 30 mg once a day.  Go  back to 20 mg dose if any significant side effects and contact office.  Patient to contact the office in a few weeks to let us  know how this is working. Because of her family history patient agrees to start low-dose cholesterol medicine.  Repeat labs in 8 to 10 weeks. Meds ordered this encounter  Medications   citalopram  (CELEXA ) 20 MG tablet    Sig: Take 1 1/2 tabs (30 mg) once a day     Dispense:  135 tablet    Refill:  0    Supervising Provider:   Charlotta Cook A [[id number]]   rosuvastatin  (CRESTOR ) 5 MG tablet    Sig: Take 1 tablet (5 mg total) by mouth daily. For cholesterol    Dispense:  90 tablet    Refill:  0    Supervising Provider:   Charlotta Cook A [[id number]]   Because of the palpitations, repeat TSH and free T4 today. Referred to cardiology to discuss need for echocardiogram and evaluate palpitations.  Warning signs reviewed.  Patient to contact office or go to urgent care/ED in the meantime if any new or worsening symptoms.

## 2024-04-09 ENCOUNTER — Ambulatory Visit: Payer: Self-pay | Admitting: Nurse Practitioner

## 2024-04-09 ENCOUNTER — Encounter: Payer: Self-pay | Admitting: Nurse Practitioner

## 2024-04-09 LAB — T4, FREE: Free T4: 1.58 ng/dL (ref 0.82–1.77)

## 2024-04-09 LAB — TSH: TSH: 1.46 u[IU]/mL (ref 0.450–4.500)

## 2024-04-12 ENCOUNTER — Encounter: Admitting: Gastroenterology

## 2024-04-13 DIAGNOSIS — E782 Mixed hyperlipidemia: Secondary | ICD-10-CM | POA: Diagnosis not present

## 2024-04-13 DIAGNOSIS — Z713 Dietary counseling and surveillance: Secondary | ICD-10-CM | POA: Diagnosis not present

## 2024-04-13 DIAGNOSIS — E038 Other specified hypothyroidism: Secondary | ICD-10-CM | POA: Diagnosis not present

## 2024-04-13 DIAGNOSIS — Z724 Inappropriate diet and eating habits: Secondary | ICD-10-CM | POA: Diagnosis not present

## 2024-04-20 ENCOUNTER — Encounter: Admitting: Gastroenterology

## 2024-04-20 DIAGNOSIS — Z713 Dietary counseling and surveillance: Secondary | ICD-10-CM | POA: Diagnosis not present

## 2024-04-20 DIAGNOSIS — E038 Other specified hypothyroidism: Secondary | ICD-10-CM | POA: Diagnosis not present

## 2024-04-20 DIAGNOSIS — E782 Mixed hyperlipidemia: Secondary | ICD-10-CM | POA: Diagnosis not present

## 2024-04-20 DIAGNOSIS — Z724 Inappropriate diet and eating habits: Secondary | ICD-10-CM | POA: Diagnosis not present

## 2024-05-03 ENCOUNTER — Telehealth: Payer: Self-pay | Admitting: *Deleted

## 2024-05-03 NOTE — Telephone Encounter (Signed)
 Received VM from pt to cancel procedure next with Dr. Cindie.  Called pt back and LM advising procedure cancelled and to call back if she wanted to reschedule

## 2024-05-04 DIAGNOSIS — Z713 Dietary counseling and surveillance: Secondary | ICD-10-CM | POA: Diagnosis not present

## 2024-05-04 DIAGNOSIS — E782 Mixed hyperlipidemia: Secondary | ICD-10-CM | POA: Diagnosis not present

## 2024-05-04 DIAGNOSIS — Z724 Inappropriate diet and eating habits: Secondary | ICD-10-CM | POA: Diagnosis not present

## 2024-05-04 DIAGNOSIS — E038 Other specified hypothyroidism: Secondary | ICD-10-CM | POA: Diagnosis not present

## 2024-05-10 ENCOUNTER — Encounter (HOSPITAL_COMMUNITY): Payer: Self-pay

## 2024-05-10 ENCOUNTER — Ambulatory Visit (HOSPITAL_COMMUNITY): Admit: 2024-05-10 | Admitting: Internal Medicine

## 2024-05-10 SURGERY — COLONOSCOPY
Anesthesia: Choice

## 2024-06-03 ENCOUNTER — Other Ambulatory Visit (HOSPITAL_BASED_OUTPATIENT_CLINIC_OR_DEPARTMENT_OTHER): Payer: Self-pay

## 2024-06-03 ENCOUNTER — Other Ambulatory Visit (HOSPITAL_COMMUNITY): Payer: Self-pay

## 2024-06-06 ENCOUNTER — Other Ambulatory Visit (HOSPITAL_COMMUNITY): Payer: Self-pay

## 2024-06-06 ENCOUNTER — Other Ambulatory Visit (HOSPITAL_BASED_OUTPATIENT_CLINIC_OR_DEPARTMENT_OTHER): Payer: Self-pay

## 2024-06-06 ENCOUNTER — Other Ambulatory Visit: Payer: Self-pay

## 2024-06-08 ENCOUNTER — Other Ambulatory Visit (HOSPITAL_COMMUNITY): Payer: Self-pay | Admitting: Obstetrics and Gynecology

## 2024-06-08 ENCOUNTER — Other Ambulatory Visit: Payer: Self-pay

## 2024-06-08 ENCOUNTER — Other Ambulatory Visit (HOSPITAL_COMMUNITY): Payer: Self-pay

## 2024-06-08 DIAGNOSIS — Z1231 Encounter for screening mammogram for malignant neoplasm of breast: Secondary | ICD-10-CM

## 2024-06-09 ENCOUNTER — Other Ambulatory Visit: Payer: Self-pay

## 2024-06-09 ENCOUNTER — Encounter (HOSPITAL_COMMUNITY): Payer: Self-pay

## 2024-06-10 ENCOUNTER — Other Ambulatory Visit (HOSPITAL_COMMUNITY): Payer: Self-pay

## 2024-06-10 ENCOUNTER — Other Ambulatory Visit: Payer: Self-pay

## 2024-06-13 ENCOUNTER — Telehealth: Payer: Self-pay

## 2024-06-13 NOTE — Telephone Encounter (Signed)
 Communication  Reason for CRM: The patient would like to speak with a member of clinical staff if needed to notify their PCP that their pharmacy will be calling to discuss a possible manufacturer change for their Rx #: [id number]    levothyroxine  (SYNTHROID ) 125 MCG tablet [[id number]] prescription  FYI

## 2024-06-15 ENCOUNTER — Ambulatory Visit: Payer: Commercial Managed Care - PPO | Admitting: Family Medicine

## 2024-06-16 ENCOUNTER — Other Ambulatory Visit (HOSPITAL_COMMUNITY): Payer: Self-pay

## 2024-06-21 DIAGNOSIS — K136 Irritative hyperplasia of oral mucosa: Secondary | ICD-10-CM | POA: Diagnosis not present

## 2024-06-29 ENCOUNTER — Ambulatory Visit (HOSPITAL_COMMUNITY)
Admission: RE | Admit: 2024-06-29 | Discharge: 2024-06-29 | Disposition: A | Discharge status: Home / Self Care | Source: Ambulatory Visit | Attending: Obstetrics and Gynecology | Admitting: Obstetrics and Gynecology

## 2024-06-29 ENCOUNTER — Ambulatory Visit (HOSPITAL_COMMUNITY)

## 2024-06-29 ENCOUNTER — Encounter (HOSPITAL_COMMUNITY): Payer: Self-pay

## 2024-06-29 DIAGNOSIS — Z1231 Encounter for screening mammogram for malignant neoplasm of breast: Secondary | ICD-10-CM | POA: Diagnosis not present

## 2024-06-29 HISTORY — DX: Other specified postprocedural states: Z98.890

## 2024-07-01 ENCOUNTER — Ambulatory Visit: Admitting: Nurse Practitioner

## 2024-07-01 VITALS — BP 102/61 | HR 64 | Temp 98.1°F | Ht 65.0 in | Wt 164.8 lb

## 2024-07-01 DIAGNOSIS — Z0001 Encounter for general adult medical examination with abnormal findings: Secondary | ICD-10-CM

## 2024-07-01 DIAGNOSIS — E038 Other specified hypothyroidism: Secondary | ICD-10-CM

## 2024-07-01 DIAGNOSIS — F909 Attention-deficit hyperactivity disorder, unspecified type: Secondary | ICD-10-CM

## 2024-07-01 DIAGNOSIS — F330 Major depressive disorder, recurrent, mild: Secondary | ICD-10-CM

## 2024-07-01 DIAGNOSIS — Z79899 Other long term (current) drug therapy: Secondary | ICD-10-CM | POA: Diagnosis not present

## 2024-07-01 DIAGNOSIS — R931 Abnormal findings on diagnostic imaging of heart and coronary circulation: Secondary | ICD-10-CM | POA: Insufficient documentation

## 2024-07-01 DIAGNOSIS — Z0000 Encounter for general adult medical examination without abnormal findings: Secondary | ICD-10-CM

## 2024-07-01 DIAGNOSIS — E7800 Pure hypercholesterolemia, unspecified: Secondary | ICD-10-CM | POA: Diagnosis not present

## 2024-07-01 NOTE — Progress Notes (Signed)
 Subjective:    Patient ID: Dawn Barton, female    DOB: November 15, 1965, 58 y.o.   MRN: [id number]  HPI Presents for annual exam for her work and recheck on anxiety and ADHD. Did not receive a call about her cardiology appointment but defers for now since palpitations have resolved since her anxiety is under better control. Has made some lifestyle changes that has helped reduce her stress.  Diet: healthy Activity: works 3 days per week but very active on her days off Once anxiety was under control, she recently started her Adderall XR 10 mg on work days which greatly helps her focus and completing tasks. Denies any side effects. Would like to continue. Taking her Rosuvastatin  2-3 times per week. Unsure how many weeks she has been on it.  Gets female physicals with GYN.    Review of Systems  HENT:  Negative for sore throat and trouble swallowing.   Respiratory:  Negative for cough, chest tightness and shortness of breath.   Cardiovascular:  Negative for chest pain and palpitations.  Gastrointestinal:  Negative for abdominal pain, blood in stool, constipation and diarrhea.  Psychiatric/Behavioral:  Positive for decreased concentration. Negative for dysphoric mood. The patient is not nervous/anxious.       04/08/2024    9:57 AM  Depression screen PHQ 2/9  Decreased Interest 1  Down, Depressed, Hopeless 0  PHQ - 2 Score 1  Altered sleeping 0  Tired, decreased energy 1  Change in appetite 1  Feeling bad or failure about yourself  0  Trouble concentrating 1  Moving slowly or fidgety/restless 0  Suicidal thoughts 0  PHQ-9 Score 4  Difficult doing work/chores Not difficult at all      04/08/2024    9:58 AM 12/22/2023   11:25 AM 12/17/2023   11:35 AM 07/16/2023    1:25 PM  GAD 7 : Generalized Anxiety Score  Nervous, Anxious, on Edge 1 0 0 0  Control/stop worrying 0 0 0 0  Worry too much - different things 0 0 0 0  Trouble relaxing 0 0 0 0  Restless 0 0 1 0  Easily annoyed or  irritable 0 0 0 0  Afraid - awful might happen 0 0 0 0  Total GAD 7 Score 1 0 1 0  Anxiety Difficulty Not difficult at all Not difficult at all Not difficult at all     Social History   Tobacco Use   Smoking status: Never   Smokeless tobacco: Never  Vaping Use   Vaping status: Never Used  Substance Use Topics   Alcohol  use: Yes    Comment: social drinker   Drug use: No        Objective:   Physical Exam Vitals and nursing note reviewed.  Constitutional:      General: She is not in acute distress. Neck:     Comments: Thyroid  nontender to palpation, no mass or goiter noted. Cardiovascular:     Rate and Rhythm: Normal rate.     Heart sounds: Normal heart sounds.  Pulmonary:     Effort: Pulmonary effort is normal.     Breath sounds: Normal breath sounds.  Abdominal:     General: There is no distension.     Palpations: Abdomen is soft.     Tenderness: There is no abdominal tenderness.  Musculoskeletal:     Cervical back: Neck supple.  Lymphadenopathy:     Cervical: No cervical adenopathy.  Neurological:     Mental Status:  She is alert and oriented to person, place, and time.  Psychiatric:        Mood and Affect: Mood normal.        Behavior: Behavior normal.        Thought Content: Thought content normal.        Judgment: Judgment normal.    Today's Vitals   07/01/24 0928  BP: 102/61  Pulse: 64  Temp: 98.1 F (36.7 C)  SpO2: 99%  Weight: 164 lb 12.8 oz (74.8 kg)  Height: 5' 5 (1.651 m)   Body mass index is 27.42 kg/m.         Assessment & Plan:   Problem List Items Addressed This Visit       Endocrine   Hypothyroidism     Other   Adult ADHD   Hyperlipidemia   Mild episode of recurrent major depressive disorder (HCC) resolved   Other Visit Diagnoses       Encounter for annual physical exam    -  Primary      TSH and free T4 were normal on 04/08/2024.  Continue same dose of thyroid  medication. Meds ordered this encounter  Medications    amphetamine -dextroamphetamine  (ADDERALL XR) 10 MG 24 hr capsule    Sig: Take 1 capsule (10 mg total) by mouth daily.    Dispense:  30 capsule    Refill:  0    Supervising Provider:   ALPHONSA HAMILTON A [[id number]]   amphetamine -dextroamphetamine  (ADDERALL XR) 10 MG 24 hr capsule    Sig: Take 1 capsule (10 mg total) by mouth daily.    Dispense:  30 capsule    Refill:  0    May fill 30 days from 07/02/24    Supervising Provider:   ALPHONSA HAMILTON A [[id number]]   amphetamine -dextroamphetamine  (ADDERALL XR) 10 MG 24 hr capsule    Sig: Take 1 capsule (10 mg total) by mouth daily.    Dispense:  30 capsule    Refill:  0    May fill 60 days from 07/02/24    Supervising Provider:   ALPHONSA HAMILTON A [[id number]]   Continue low dose Adderall mainly on days she works. Follow up as needed before further refills.  Recheck lipids. Based on results, consider taking Rosuvastatin  daily.  Continue healthy lifestyle habits.  Return in about 1 year (around 07/01/2025) for physical.

## 2024-07-02 ENCOUNTER — Encounter: Payer: Self-pay | Admitting: Nurse Practitioner

## 2024-07-02 ENCOUNTER — Other Ambulatory Visit (HOSPITAL_COMMUNITY): Payer: Self-pay

## 2024-07-02 LAB — LIPID PANEL
Chol/HDL Ratio: 3.4 ratio (ref 0.0–4.4)
Cholesterol, Total: 227 mg/dL — ABNORMAL HIGH (ref 100–199)
HDL: 67 mg/dL (ref >39)
LDL Chol Calc (NIH): 147 mg/dL — ABNORMAL HIGH (ref 0–99)
Triglycerides: 77 mg/dL (ref 0–149)
VLDL Cholesterol Cal: 13 mg/dL (ref 5–40)

## 2024-07-02 LAB — HEPATIC FUNCTION PANEL
ALT: 17 IU/L (ref 0–32)
AST: 17 IU/L (ref 0–40)
Albumin: 4.7 g/dL (ref 3.8–4.9)
Alkaline Phosphatase: 58 IU/L (ref 44–121)
Bilirubin Total: 0.3 mg/dL (ref 0.0–1.2)
Bilirubin, Direct: 0.09 mg/dL (ref 0.00–0.40)
Total Protein: 7.1 g/dL (ref 6.0–8.5)

## 2024-07-02 MED ORDER — AMPHETAMINE-DEXTROAMPHET ER 10 MG PO CP24
10.0000 mg | ORAL_CAPSULE | Freq: Every day | ORAL | 0 refills | Status: DC
  Filled 2024-07-02: qty 30, 30d supply, fill #0

## 2024-07-02 MED ORDER — AMPHETAMINE-DEXTROAMPHET ER 10 MG PO CP24
10.0000 mg | ORAL_CAPSULE | Freq: Every day | ORAL | 0 refills | Status: DC
  Filled 2024-07-02 – 2024-08-23 (×2): qty 30, 30d supply, fill #0

## 2024-07-05 ENCOUNTER — Other Ambulatory Visit: Payer: Self-pay

## 2024-07-05 ENCOUNTER — Other Ambulatory Visit (HOSPITAL_COMMUNITY): Payer: Self-pay

## 2024-07-29 ENCOUNTER — Other Ambulatory Visit: Payer: Self-pay | Admitting: Nurse Practitioner

## 2024-08-01 ENCOUNTER — Other Ambulatory Visit: Payer: Self-pay | Admitting: Family Medicine

## 2024-08-01 ENCOUNTER — Other Ambulatory Visit (HOSPITAL_COMMUNITY): Payer: Self-pay

## 2024-08-01 ENCOUNTER — Other Ambulatory Visit: Payer: Self-pay

## 2024-08-01 MED ORDER — CITALOPRAM HYDROBROMIDE 20 MG PO TABS
30.0000 mg | ORAL_TABLET | Freq: Every day | ORAL | 0 refills | Status: DC
  Filled 2024-08-01: qty 135, 90d supply, fill #0

## 2024-08-02 ENCOUNTER — Other Ambulatory Visit (HOSPITAL_COMMUNITY): Payer: Self-pay

## 2024-08-02 MED ORDER — LEVOTHYROXINE SODIUM 125 MCG PO TABS
125.0000 ug | ORAL_TABLET | Freq: Every day | ORAL | 1 refills | Status: AC
  Filled 2024-08-02 – 2024-09-02 (×2): qty 90, 90d supply, fill #0
  Filled 2024-12-02: qty 90, 90d supply, fill #1

## 2024-08-23 ENCOUNTER — Other Ambulatory Visit: Payer: Self-pay | Admitting: Nurse Practitioner

## 2024-08-23 ENCOUNTER — Other Ambulatory Visit: Payer: Self-pay

## 2024-08-25 ENCOUNTER — Other Ambulatory Visit (HOSPITAL_COMMUNITY): Payer: Self-pay

## 2024-08-25 MED ORDER — HYDROCORTISONE 2.5 % EX CREA
TOPICAL_CREAM | Freq: Two times a day (BID) | CUTANEOUS | 0 refills | Status: AC
  Filled 2024-08-25: qty 30, 15d supply, fill #0

## 2024-08-26 ENCOUNTER — Other Ambulatory Visit (HOSPITAL_COMMUNITY): Payer: Self-pay

## 2024-09-02 ENCOUNTER — Other Ambulatory Visit (HOSPITAL_COMMUNITY): Payer: Self-pay

## 2024-09-02 ENCOUNTER — Other Ambulatory Visit: Payer: Self-pay

## 2024-09-05 ENCOUNTER — Encounter (HOSPITAL_COMMUNITY): Payer: Self-pay

## 2024-09-06 ENCOUNTER — Other Ambulatory Visit: Payer: Self-pay

## 2024-09-06 ENCOUNTER — Other Ambulatory Visit (HOSPITAL_COMMUNITY): Payer: Self-pay

## 2024-09-20 DIAGNOSIS — H524 Presbyopia: Secondary | ICD-10-CM | POA: Diagnosis not present

## 2024-09-20 DIAGNOSIS — H52223 Regular astigmatism, bilateral: Secondary | ICD-10-CM | POA: Diagnosis not present

## 2024-09-20 DIAGNOSIS — H5203 Hypermetropia, bilateral: Secondary | ICD-10-CM | POA: Diagnosis not present

## 2024-10-17 ENCOUNTER — Other Ambulatory Visit: Payer: Self-pay | Admitting: Nurse Practitioner

## 2024-10-17 ENCOUNTER — Encounter: Payer: Self-pay | Admitting: Nurse Practitioner

## 2024-10-17 DIAGNOSIS — F909 Attention-deficit hyperactivity disorder, unspecified type: Secondary | ICD-10-CM

## 2024-10-18 ENCOUNTER — Other Ambulatory Visit: Payer: Self-pay

## 2024-10-18 ENCOUNTER — Other Ambulatory Visit (HOSPITAL_COMMUNITY): Payer: Self-pay

## 2024-10-18 ENCOUNTER — Other Ambulatory Visit: Payer: Self-pay | Admitting: Nurse Practitioner

## 2024-10-18 DIAGNOSIS — F909 Attention-deficit hyperactivity disorder, unspecified type: Secondary | ICD-10-CM

## 2024-10-18 MED ORDER — AMPHETAMINE-DEXTROAMPHET ER 10 MG PO CP24
10.0000 mg | ORAL_CAPSULE | Freq: Every day | ORAL | 0 refills | Status: DC
  Filled 2024-10-18: qty 30, 30d supply, fill #0

## 2024-10-28 ENCOUNTER — Other Ambulatory Visit (HOSPITAL_COMMUNITY): Payer: Self-pay

## 2024-11-08 ENCOUNTER — Other Ambulatory Visit: Payer: Self-pay

## 2024-11-08 ENCOUNTER — Ambulatory Visit: Admitting: Nurse Practitioner

## 2024-11-08 ENCOUNTER — Other Ambulatory Visit (HOSPITAL_COMMUNITY): Payer: Self-pay

## 2024-11-08 VITALS — BP 116/73 | HR 69 | Temp 97.7°F | Ht 65.0 in | Wt 170.1 lb

## 2024-11-08 DIAGNOSIS — F909 Attention-deficit hyperactivity disorder, unspecified type: Secondary | ICD-10-CM | POA: Diagnosis not present

## 2024-11-08 DIAGNOSIS — F330 Major depressive disorder, recurrent, mild: Secondary | ICD-10-CM | POA: Diagnosis not present

## 2024-11-08 DIAGNOSIS — E663 Overweight: Secondary | ICD-10-CM

## 2024-11-08 MED ORDER — AMPHETAMINE-DEXTROAMPHET ER 10 MG PO CP24
10.0000 mg | ORAL_CAPSULE | Freq: Every day | ORAL | 0 refills | Status: AC
  Filled 2024-11-08: qty 30, 30d supply, fill #0

## 2024-11-08 MED ORDER — CITALOPRAM HYDROBROMIDE 20 MG PO TABS
30.0000 mg | ORAL_TABLET | Freq: Every day | ORAL | 1 refills | Status: AC
  Filled 2024-11-08: qty 135, 90d supply, fill #0

## 2024-11-11 ENCOUNTER — Encounter: Payer: Self-pay | Admitting: Nurse Practitioner

## 2024-11-11 NOTE — Progress Notes (Signed)
 "  Subjective:    Patient ID: Dawn Barton, female    DOB: 1966-10-12, 59 y.o.   MRN: [id number]  HPI Discussed the use of AI scribe software for clinical note transcription with the patient, who gave verbal consent to proceed.  History of Present Illness Dawn Barton is a 59 year old female who presents for a follow-up visit regarding her medication management and weight management goals.  She is currently taking a low dose of Adderall, which aids her focus at work. She sometimes delays taking it until 10 or 11 in the morning, which covers most of her day. No side effects such as chest pain, palpitations, headache, or trouble sleeping are reported.  She is interested in pursuing a healthy weight and wellness program, as her weight has been stable within a five to ten pound range since January 2024. She is particularly concerned about reducing fat content in her body, especially around her abdomen, which she attributes to postmenopausal changes. She maintains an active lifestyle, including running and weightlifting, and tries to eat healthily, although she acknowledges occasional overeating during stressful times or holidays.  Her current medication regimen includes Celexa  (citalopram ) and levothyroxine  (Synthroid ). She also takes rosuvastatin  5 mg, although not daily, to manage her cholesterol levels. She has previously undergone a CT calcium  score test.  Her weight has remained stable. She is not due for blood work until later in the year, as her previous labs were updated in August.     11/08/2024   11:02 AM  Depression screen PHQ 2/9  Decreased Interest 0  Down, Depressed, Hopeless 0  PHQ - 2 Score 0  Altered sleeping 0  Tired, decreased energy 0  Change in appetite 0  Feeling bad or failure about yourself  0  Trouble concentrating 0  Moving slowly or fidgety/restless 0  Suicidal thoughts 0  PHQ-9 Score 0  Difficult doing work/chores Not difficult at all      11/08/2024    11:03 AM 04/08/2024    9:58 AM 12/22/2023   11:25 AM 12/17/2023   11:35 AM  GAD 7 : Generalized Anxiety Score  Nervous, Anxious, on Edge 0 1 0 0  Control/stop worrying 0 0 0 0  Worry too much - different things 0 0 0 0  Trouble relaxing 0 0 0 0  Restless 0 0 0 1  Easily annoyed or irritable 1 0 0 0  Afraid - awful might happen 0 0 0 0  Total GAD 7 Score 1 1 0 1  Anxiety Difficulty Not difficult at all Not difficult at all Not difficult at all Not difficult at all    Social History[1]       Objective:   Physical Exam Vitals and nursing note reviewed.  Constitutional:      General: She is not in acute distress. Cardiovascular:     Rate and Rhythm: Normal rate and regular rhythm.     Heart sounds: Normal heart sounds.  Pulmonary:     Effort: Pulmonary effort is normal.     Breath sounds: Normal breath sounds.  Neurological:     Mental Status: She is alert and oriented to person, place, and time.  Psychiatric:        Mood and Affect: Mood normal.        Behavior: Behavior normal.        Thought Content: Thought content normal.        Judgment: Judgment normal.    Today's Vitals  11/08/24 1100  BP: 116/73  Pulse: 69  Temp: 97.7 F (36.5 C)  SpO2: 98%  Weight: 170 lb 2 oz (77.2 kg)  Height: 5' 5 (1.651 m)   Body mass index is 28.31 kg/m.         Assessment & Plan:  1. Overweight (BMI 25.0-29.9) Interested in healthy weight and wellness programs. Maintains healthy lifestyle with regular exercise and balanced diet. - Provided referral for healthy weight and wellness program. - Encouraged continued healthy lifestyle and moderation in diet. - Amb Ref to Medical Weight Management  2. Adult ADHD (Primary) ADHD managed with Adderall XR 10 mg on workdays. Effective focus without side effects. - Continue Adderall XR 10 mg on workdays as needed. - Provided three-month refills for Adderall XR.  - amphetamine -dextroamphetamine  (ADDERALL XR) 10 MG 24 hr capsule; Take  1 capsule (10 mg total) by mouth daily.  Dispense: 30 capsule; Refill: 0 - amphetamine -dextroamphetamine  (ADDERALL XR) 10 MG 24 hr capsule; Take 1 capsule (10 mg total) by mouth daily.  Dispense: 30 capsule; Refill: 0 - amphetamine -dextroamphetamine  (ADDERALL XR) 10 MG 24 hr capsule; Take 1 capsule (10 mg total) by mouth daily.  Dispense: 30 capsule; Refill: 0  3. Mild episode of recurrent major depressive disorder Managed with citalopram . Medication effective. - Continue citalopram  as prescribed.  - citalopram  (CELEXA ) 20 MG tablet; Take 1.5 tablets (30 mg total) by mouth daily.  Dispense: 135 tablet; Refill: 1  Continue other medications as directed. Return in about 6 months (around 05/08/2025) for physical.       [1]  Social History Tobacco Use   Smoking status: Never   Smokeless tobacco: Never  Vaping Use   Vaping status: Never Used  Substance Use Topics   Alcohol  use: Yes    Comment: social drinker   Drug use: No   "

## 2024-12-02 ENCOUNTER — Other Ambulatory Visit (HOSPITAL_COMMUNITY): Payer: Self-pay

## 2024-12-02 ENCOUNTER — Institutional Professional Consult (permissible substitution)

## 2025-05-09 ENCOUNTER — Ambulatory Visit: Admitting: Nurse Practitioner
